# Patient Record
Sex: Male | Born: 1965 | Race: Black or African American | Hispanic: No | Marital: Single | Smoking: Current every day smoker
Health system: Southern US, Community
[De-identification: ages and names within clinical notes are randomized; demographics above are authoritative.]

## PROBLEM LIST (undated history)

## (undated) DIAGNOSIS — Z21 Asymptomatic human immunodeficiency virus [HIV] infection status: Secondary | ICD-10-CM

## (undated) DIAGNOSIS — B20 Human immunodeficiency virus [HIV] disease: Secondary | ICD-10-CM

## (undated) DIAGNOSIS — F1910 Other psychoactive substance abuse, uncomplicated: Secondary | ICD-10-CM

## (undated) HISTORY — PX: FINGER SURGERY: SHX640

---

## 2016-05-24 ENCOUNTER — Encounter: Payer: Self-pay | Admitting: Emergency Medicine

## 2016-05-24 ENCOUNTER — Emergency Department
Admission: EM | Admit: 2016-05-24 | Discharge: 2016-05-24 | Disposition: A | Discharge status: Home / Self Care | Payer: Self-pay | Attending: Student | Admitting: Student

## 2016-05-24 DIAGNOSIS — R45851 Suicidal ideations: Secondary | ICD-10-CM | POA: Insufficient documentation

## 2016-05-24 DIAGNOSIS — F17210 Nicotine dependence, cigarettes, uncomplicated: Secondary | ICD-10-CM | POA: Insufficient documentation

## 2016-05-24 DIAGNOSIS — F1910 Other psychoactive substance abuse, uncomplicated: Secondary | ICD-10-CM | POA: Insufficient documentation

## 2016-05-24 LAB — URINE DRUG SCREEN, QUALITATIVE (ARMC ONLY)
AMPHETAMINES, UR SCREEN: NOT DETECTED
Barbiturates, Ur Screen: NOT DETECTED
Benzodiazepine, Ur Scrn: NOT DETECTED
Cannabinoid 50 Ng, Ur ~~LOC~~: NOT DETECTED
Cocaine Metabolite,Ur ~~LOC~~: POSITIVE — AB
MDMA (ECSTASY) UR SCREEN: NOT DETECTED
METHADONE SCREEN, URINE: NOT DETECTED
OPIATE, UR SCREEN: NOT DETECTED
PHENCYCLIDINE (PCP) UR S: NOT DETECTED
Tricyclic, Ur Screen: NOT DETECTED

## 2016-05-24 LAB — CBC
HEMATOCRIT: 47.7 % (ref 40.0–52.0)
HEMOGLOBIN: 16.6 g/dL (ref 13.0–18.0)
MCH: 31.7 pg (ref 26.0–34.0)
MCHC: 34.8 g/dL (ref 32.0–36.0)
MCV: 91.2 fL (ref 80.0–100.0)
Platelets: 287 10*3/uL (ref 150–440)
RBC: 5.23 MIL/uL (ref 4.40–5.90)
RDW: 12.9 % (ref 11.5–14.5)
WBC: 20.3 10*3/uL — AB (ref 3.8–10.6)

## 2016-05-24 LAB — COMPREHENSIVE METABOLIC PANEL
ALBUMIN: 4.9 g/dL (ref 3.5–5.0)
ALT: 26 U/L (ref 17–63)
AST: 46 U/L — AB (ref 15–41)
Alkaline Phosphatase: 64 U/L (ref 38–126)
Anion gap: 10 (ref 5–15)
BILIRUBIN TOTAL: 1.1 mg/dL (ref 0.3–1.2)
BUN: 21 mg/dL — AB (ref 6–20)
CHLORIDE: 103 mmol/L (ref 101–111)
CO2: 22 mmol/L (ref 22–32)
Calcium: 9.7 mg/dL (ref 8.9–10.3)
Creatinine, Ser: 1.37 mg/dL — ABNORMAL HIGH (ref 0.61–1.24)
GFR calc Af Amer: >60 mL/min (ref >60)
GFR calc non Af Amer: 58 mL/min — ABNORMAL LOW (ref >60)
GLUCOSE: 115 mg/dL — AB (ref 65–99)
POTASSIUM: 4.3 mmol/L (ref 3.5–5.1)
SODIUM: 135 mmol/L (ref 135–145)
Total Protein: 8.9 g/dL — ABNORMAL HIGH (ref 6.5–8.1)

## 2016-05-24 LAB — ACETAMINOPHEN LEVEL

## 2016-05-24 LAB — ETHANOL: Alcohol, Ethyl (B): <5 mg/dL (ref <5)

## 2016-05-24 LAB — SALICYLATE LEVEL: Salicylate Lvl: <4 mg/dL (ref 2.8–30.0)

## 2016-05-24 NOTE — ED Notes (Signed)
Patient taking a shower. He will be discharged afterwards. He denies SI or HI. Discharge instructions reviewed with patient, he verbalizes understanding. Patient received all personal belongings.

## 2016-05-24 NOTE — ED Triage Notes (Signed)
Patient brought in by bpd. Patient states that he wants help to quit using drugs. Patient states that he has been using crack cocaine since the 80's. Patient also states that he has had thoughts of wanting to hurt himself, denies a plan.

## 2016-05-24 NOTE — ED Provider Notes (Signed)
-----------------------------------------   10:51 AM on 05/24/2016 -----------------------------------------  Patient is here voluntarily for substance abuse. Patient has no SI today. Patient wishes to go home at this time. We will discharge the patient home, he has been provided outpatient resources.   Minna Antis, MD 05/24/16 1051

## 2016-05-24 NOTE — ED Notes (Signed)
Report given to Amy,RN at this time. 

## 2016-05-24 NOTE — ED Provider Notes (Signed)
[facility] Emergency Department Provider Note   ____________________________________________  Time seen: 2:15 AM  I have reviewed the triage vital signs and the nursing notes.   HISTORY  Chief Complaint Suicidal    HPI Dakota Johnson is a 50 y.o. male with longstanding issues with substance abuse who presents for evaluation of suicidal ideation today as well as with the desire to detox from crack cocaine, gradual onset, severe, worse in the setting of him being recently homeless. The patient reports that he last used crack cocaine earlier today. He reports that he became depressed about his life, about his homelessness and briefly had thoughts that he should commit suicide. He has no specific plan for this. No homicidal ideation or audiovisual hallucinations.no recent illness including no cough, vomiting, diarrhea, fevers or chills. No pain complaints including no chest pain, or abdominal pain. No shortness of breath.   History reviewed. No pertinent past medical history.  There are no active problems to display for this patient.   Past Surgical History:  Procedure Laterality Date  . FINGER SURGERY      Prior to Admission medications   Not on File    Allergies Review of patient's allergies indicates no known allergies.  No family history on file.  Social History Social History  Substance Use Topics  . Smoking status: Current Every Day Smoker    Types: Cigarettes  . Smokeless tobacco: Never Used  . Alcohol use 6.0 oz/week    10 Cans of beer per week    Review of Systems Constitutional: No fever/chills Eyes: No visual changes. ENT: No sore throat. Cardiovascular: Denies chest pain. Respiratory: Denies shortness of breath. Gastrointestinal: No abdominal pain.  No nausea, no vomiting.  No diarrhea.  No constipation. Genitourinary: Negative for dysuria. Musculoskeletal: Negative for back pain. Skin: Negative for rash. Neurological:  Negative for headaches, focal weakness or numbness.  10-point ROS otherwise negative.  ____________________________________________   PHYSICAL EXAM:  VITAL SIGNS: ED Triage Vitals  Enc Vitals Group     BP 05/24/16 0208 140/85     Pulse Rate 05/24/16 0207 (!) 106     Resp 05/24/16 0207 18     Temp 05/24/16 0207 98.2 F (36.8 C)     Temp Source 05/24/16 0207 Oral     SpO2 05/24/16 0207 98 %     Weight 05/24/16 0207 185 lb (83.9 kg)     Height 05/24/16 0207  (1.778 m)     Head Circumference --      Peak Flow --      Pain Score 05/24/16 0208 7     Pain Loc --      Pain Edu? --      Excl. in GC? --     Constitutional: Alert and oriented. Well appearing and in no acute distress. Eyes: Conjunctivae are normal. PERRL. EOMI. Head: Atraumatic. Nose: No congestion/rhinnorhea. Mouth/Throat: Mucous membranes are moist.  Oropharynx non-erythematous. Neck: No stridor.   Cardiovascular: Mildly tachycardic rate, regular rhythm. Grossly normal heart sounds.  Good peripheral circulation. Respiratory: Normal respiratory effort.  No retractions. Lungs CTAB. Gastrointestinal: Soft and nontender. No distention.  No CVA tenderness. Genitourinary: deferred Musculoskeletal: No lower extremity tenderness nor edema.  No joint effusions. Neurologic:  Normal speech and language. No gross focal neurologic deficits are appreciated. No gait instability. Skin:  Skin is warm, dry and intact. No rash noted. Psychiatric: Mood and affect are normal. Speech and behavior are normal.  ____________________________________________   LABS (  all labs ordered are listed, but only abnormal results are displayed)  Labs Reviewed  COMPREHENSIVE METABOLIC PANEL - Abnormal; Notable for the following:       Result Value   Glucose, Bld 115 (*)    BUN 21 (*)    Creatinine, Ser 1.37 (*)    Total Protein 8.9 (*)    AST 46 (*)    GFR calc non Af Amer 58 (*)    All other components within normal limits    ACETAMINOPHEN LEVEL - Abnormal; Notable for the following:    Acetaminophen (Tylenol), Serum <10 (*)    All other components within normal limits  CBC - Abnormal; Notable for the following:    WBC 20.3 (*)    All other components within normal limits  URINE DRUG SCREEN, QUALITATIVE ([facility] ONLY) - Abnormal; Notable for the following:    Cocaine Metabolite,Ur [place] POSITIVE (*)    All other components within normal limits  ETHANOL  SALICYLATE LEVEL   ____________________________________________  EKG  none ____________________________________________  RADIOLOGY  none ____________________________________________   PROCEDURES  Procedure(s) performed: None  Procedures  Critical Care performed: No  ____________________________________________   INITIAL IMPRESSION / ASSESSMENT AND PLAN / ED COURSE  Pertinent labs & imaging results that were available during my care of the patient were reviewed by me and considered in my medical decision making (see chart for details).  Dakota Johnson is a 50 y.o. male with longstanding issues with substance abusewho presents for evaluation of suicidal ideation today as well as with the desire to detox from crack cocaine. On exam, he is very well-appearing and in no acute distress. Vital signs are notable for mild tachycardia, this could be secondary to his crack cocaine use, will reassess frequently. The remainder of his vital signs are stable and he is afebrile. He has a benign physical exam and no acute medical complaints. Will consult behavioral health as well as psychiatry. I reviewed the screening psychiatric labs which were ordered.White blood cell count is elevated at 20,000 which again is likely related  to his crack cocaine use/is catecholamine-induced. Urine drug screen is positive for cocaine only. Undetectable alcohol, salicylate and acetaminophen levels.creatinine mildly elevated at 1.37. The patient is medically cleared for TTS and  psychiatry evaluation.  Clinical Course     ____________________________________________   FINAL CLINICAL IMPRESSION(S) / ED DIAGNOSES  Final diagnoses:  Suicidal ideation  Polysubstance abuse      NEW MEDICATIONS STARTED DURING THIS VISIT:  New Prescriptions   No medications on file     Note:  This document was prepared using Dragon voice recognition software and may include unintentional dictation errors.    Gayla Doss, MD 05/24/16 [phone]

## 2016-05-24 NOTE — ED Notes (Signed)
Patient asleep in room. No noted distress or abnormal behavior. Will continue 15 minute checks and observation by security cameras for safety. 

## 2016-05-24 NOTE — ED Notes (Signed)
Patient awake, alert, and oriented. Cooperative with all nursing interventions. He denies SI or HI. Patient states that he wants help with addiction, specifically residential treatment.

## 2016-05-24 NOTE — ED Notes (Signed)
Patient remains calm and cooperative with no issues to report at this time. Will continue to monitor for safety.Q15 minute rounds continue.

## 2016-05-24 NOTE — BH Assessment (Signed)
Assessment Note  Dakota Johnson is an 50 y.o. male. Dakota Johnson arrived to the ED by way of [place] police.  He reports that he flagged them down. He reports that his drug use was a problem.  He states the more he uses, the worse his cravings are getting.  He states the drugs are starting to work on his mind and he wants to get some help. He report "slight" depression.  He states that earlier he was having "suicidal thoughts running across my mind". He denied having current thoughts of suicidal.  He states that he "got out of prison" today, and went back to using.  He denied symptoms of anxiety. He denied having auditory or visual hallucinations. He denied having homicidal ideation or intent. He reports the use of crack cocaine daily.  Diagnosis: SI, Substance abuse  Past Medical History: History reviewed. No pertinent past medical history.  Past Surgical History:  Procedure Laterality Date  . FINGER SURGERY      Family History: No family history on file.  Social History:  reports that he has been smoking Cigarettes.  He has never used smokeless tobacco. He reports that he drinks about 6.0 oz of alcohol per week . He reports that he uses drugs, including Cocaine.  Additional Social History:  Alcohol / Drug Use History of alcohol / drug use?: Yes Substance #1 Name of Substance 1: Cocaine (Crack) 1 - Age of First Use: 23 1 - Amount (size/oz): varied 1 - Frequency: daily 1 - Last Use / Amount: 05/23/2016  CIWA: CIWA-Ar BP: 140/85 Pulse Rate: (!) 106 COWS:    Allergies: No Known Allergies  Home Medications:  (Not in a hospital admission)  OB/GYN Status:  No LMP for male patient.  General Assessment Data Location of Assessment: [facility] ED TTS Assessment: In system Is this a Tele or Face-to-Face Assessment?: Face-to-Face Is this an Initial Assessment or a Re-assessment for this encounter?: Initial Assessment Marital status: Single Maiden name: n/a Is patient pregnant?:  No Pregnancy Status: No Living Arrangements: Alone (Currently homeless) Can pt return to current living arrangement?: Yes Admission Status: Voluntary Is patient capable of signing voluntary admission?: Yes Referral Source: Self/Family/Friend Insurance type: None  Medical Screening Exam [facility] Walk-in ONLY) Medical Exam completed: Yes  Crisis Care Plan Living Arrangements: Alone (Currently homeless) Legal Guardian: Other: (Self) Name of Psychiatrist: None Name of Therapist: None  Education Status Is patient currently in school?: No Current Grade: n/a Highest grade of school patient has completed: one year of college Name of school: [employer] Contact person: n/a  Risk to self with the past 6 months Suicidal Ideation: No-Not Currently/Within Last 6 Months Has patient been a risk to self within the past 6 months prior to admission? : No Suicidal Intent: No Has patient had any suicidal intent within the past 6 months prior to admission? : No Is patient at risk for suicide?: No Suicidal Plan?: No Has patient had any suicidal plan within the past 6 months prior to admission? : No Access to Means: No What has been your use of drugs/alcohol within the last 12 months?: daily use of cocaine Previous Attempts/Gestures: No How many times?: 0 Other Self Harm Risks: denied Triggers for Past Attempts: None known Intentional Self Injurious Behavior: None Family Suicide History: Yes (Brother (2000)) Recent stressful life event(s): Other (Comment) (Substance abuse, recent incarceration) Persecutory voices/beliefs?: No Depression: Yes Depression Symptoms: Feeling worthless/self pity Substance abuse history and/or treatment for substance abuse?: Yes Suicide prevention  information given to non-admitted patients: Not applicable  Risk to Others within the past 6 months Homicidal Ideation: No Does patient have any lifetime risk of violence toward others beyond the six months prior to  admission? : No Thoughts of Harm to Others: No Current Homicidal Intent: No Current Homicidal Plan: No Access to Homicidal Means: No Identified Victim: None identified History of harm to others?: No Assessment of Violence: None Noted Violent Behavior Description: denied Does patient have access to weapons?: No Criminal Charges Pending?: No Does patient have a court date: No Is patient on probation?: No  Psychosis Hallucinations: None noted Delusions: None noted  Mental Status Report Appearance/Hygiene: In scrubs Eye Contact: Good Motor Activity: Freedom of movement Speech: Logical/coherent Level of Consciousness: Alert Mood: Depressed Affect: Flat Anxiety Level: None Thought Processes: Coherent Judgement: Unimpaired Orientation: Person, Time, Place, Situation Obsessive Compulsive Thoughts/Behaviors: None  Cognitive Functioning Concentration: Normal Memory: Recent Intact IQ: Average Insight: Good Impulse Control: Fair Appetite: Good Sleep: No Change Vegetative Symptoms: None  ADLScreening [facility]) Patient's cognitive ability adequate to safely complete daily activities?: Yes Patient able to express need for assistance with ADLs?: Yes Independently performs ADLs?: Yes (appropriate for developmental age)  Prior Inpatient Therapy Prior Inpatient Therapy: No Prior Therapy Dates: n/a Prior Therapy Facilty/Provider(s): n/a Reason for Treatment: n/a  Prior Outpatient Therapy Prior Outpatient Therapy: No Prior Therapy Dates: n/a Prior Therapy Facilty/Provider(s): n/a Reason for Treatment: n/a Does patient have an ACCT team?: No Does patient have Intensive In-House Services?  : No Does patient have [facility] services? : No Does patient have P4CC services?: No  ADL Screening (condition at time of admission) Patient's cognitive ability adequate to safely complete daily activities?: Yes Patient able to express need for assistance with ADLs?:  Yes Independently performs ADLs?: Yes (appropriate for developmental age)       Abuse/Neglect Assessment (Assessment to be complete while patient is alone) Physical Abuse: Denies Verbal Abuse: Denies Sexual Abuse: Denies Exploitation of patient/patient's resources: Denies Self-Neglect: Denies     Merchant navy officer (For Healthcare) Does patient have an advance directive?: No    Additional Information 1:1 In Past 12 Months?: No CIRT Risk: No Elopement Risk: No Does patient have medical clearance?: Yes     Disposition:  Disposition Initial Assessment Completed for this Encounter: Yes Disposition of Patient: Other dispositions  On Site Evaluation by:   Reviewed with Physician:    Justice Deeds 05/24/2016 3:26 AM

## 2016-05-24 NOTE — ED Notes (Signed)

## 2016-05-24 NOTE — ED Notes (Signed)
Patient to [facility] from ED ambulatory without difficulty, to room.  Patient is is alert and oriented and in no apparent distress. Patient denies SI, HI. Patient is calm and cooperative. Patient made aware of security cameras and Q15 minute rounds. Patient encouraged to let Nursing staff know of any concerns or needs.

## 2016-05-30 ENCOUNTER — Emergency Department
Admission: EM | Admit: 2016-05-30 | Discharge: 2016-05-30 | Disposition: A | Discharge status: Other Acute Inpatient Hospital | Payer: Self-pay | Attending: Emergency Medicine | Admitting: Emergency Medicine

## 2016-05-30 ENCOUNTER — Observation Stay (HOSPITAL_COMMUNITY)
Admission: AD | Admit: 2016-05-30 | Discharge: 2016-05-31 | Disposition: A | Discharge status: Home / Self Care | Payer: Self-pay | Source: Intra-hospital | Attending: Psychiatry | Admitting: Psychiatry

## 2016-05-30 ENCOUNTER — Encounter: Payer: Self-pay | Admitting: Emergency Medicine

## 2016-05-30 ENCOUNTER — Encounter (HOSPITAL_COMMUNITY): Payer: Self-pay | Admitting: *Deleted

## 2016-05-30 DIAGNOSIS — F1410 Cocaine abuse, uncomplicated: Principal | ICD-10-CM | POA: Insufficient documentation

## 2016-05-30 DIAGNOSIS — F17210 Nicotine dependence, cigarettes, uncomplicated: Secondary | ICD-10-CM | POA: Insufficient documentation

## 2016-05-30 DIAGNOSIS — Z5181 Encounter for therapeutic drug level monitoring: Secondary | ICD-10-CM | POA: Insufficient documentation

## 2016-05-30 DIAGNOSIS — F1910 Other psychoactive substance abuse, uncomplicated: Secondary | ICD-10-CM

## 2016-05-30 HISTORY — DX: Other psychoactive substance abuse, uncomplicated: F19.10

## 2016-05-30 LAB — COMPREHENSIVE METABOLIC PANEL
ALT: 35 U/L (ref 17–63)
AST: 46 U/L — ABNORMAL HIGH (ref 15–41)
Albumin: 4.5 g/dL (ref 3.5–5.0)
Alkaline Phosphatase: 53 U/L (ref 38–126)
Anion gap: 6 (ref 5–15)
BILIRUBIN TOTAL: 0.8 mg/dL (ref 0.3–1.2)
BUN: 14 mg/dL (ref 6–20)
CALCIUM: 9 mg/dL (ref 8.9–10.3)
CHLORIDE: 107 mmol/L (ref 101–111)
CO2: 24 mmol/L (ref 22–32)
Creatinine, Ser: 1.05 mg/dL (ref 0.61–1.24)
Glucose, Bld: 121 mg/dL — ABNORMAL HIGH (ref 65–99)
Potassium: 3.8 mmol/L (ref 3.5–5.1)
Sodium: 137 mmol/L (ref 135–145)
TOTAL PROTEIN: 8.3 g/dL — AB (ref 6.5–8.1)

## 2016-05-30 LAB — URINE DRUG SCREEN, QUALITATIVE (ARMC ONLY)
Amphetamines, Ur Screen: NOT DETECTED
BARBITURATES, UR SCREEN: NOT DETECTED
Benzodiazepine, Ur Scrn: NOT DETECTED
CANNABINOID 50 NG, UR ~~LOC~~: NOT DETECTED
Cocaine Metabolite,Ur ~~LOC~~: POSITIVE — AB
MDMA (ECSTASY) UR SCREEN: NOT DETECTED
Methadone Scn, Ur: NOT DETECTED
Opiate, Ur Screen: NOT DETECTED
PHENCYCLIDINE (PCP) UR S: NOT DETECTED
TRICYCLIC, UR SCREEN: NOT DETECTED

## 2016-05-30 LAB — CBC
HCT: 44 % (ref 40.0–52.0)
Hemoglobin: 15 g/dL (ref 13.0–18.0)
MCH: 31.2 pg (ref 26.0–34.0)
MCHC: 34 g/dL (ref 32.0–36.0)
MCV: 91.7 fL (ref 80.0–100.0)
PLATELETS: 264 10*3/uL (ref 150–440)
RBC: 4.8 MIL/uL (ref 4.40–5.90)
RDW: 13.2 % (ref 11.5–14.5)
WBC: 12.1 10*3/uL — ABNORMAL HIGH (ref 3.8–10.6)

## 2016-05-30 LAB — ETHANOL

## 2016-05-30 LAB — ACETAMINOPHEN LEVEL: Acetaminophen (Tylenol), Serum: <10 ug/mL — ABNORMAL LOW (ref 10–30)

## 2016-05-30 LAB — SALICYLATE LEVEL

## 2016-05-30 MED ORDER — ACETAMINOPHEN 325 MG PO TABS: 650.0000 mg | ORAL_TABLET | Freq: Four times a day (QID) | ORAL | Status: DC | PRN

## 2016-05-30 MED ORDER — HYDROXYZINE HCL 25 MG PO TABS: 25.0000 mg | ORAL_TABLET | ORAL | Status: DC | PRN

## 2016-05-30 MED ORDER — TRAZODONE HCL 50 MG PO TABS: 50.0000 mg | ORAL_TABLET | Freq: Every evening | ORAL | Status: DC | PRN

## 2016-05-30 NOTE — ED Triage Notes (Signed)
"  I just don't know what to do.  I'm on crack cocaine and it is getting to the point that I am feeling like I want to end it at times.  I just need to get some help."  Arrives to ED with [employer] voluntarily .  Denies current SI/ HI but states earlier today he was having SI and HI.

## 2016-05-30 NOTE — ED Notes (Signed)
Patient has signed consent to transfer form.

## 2016-05-30 NOTE — ED Notes (Signed)
Transportation has arrived. Dakota Johnson and this RN ambulated patient to lobby for pick up.

## 2016-05-30 NOTE — BH Assessment (Signed)
Assessment Note  Dakota Johnson is an 50 y.o. male who presents to the ER seeking assistance and treatment for his cocaine and alcohol use. Patient states he has abused drugs since the "80's." The longest he has gone without anything is 11 years. His last time of use was today, 05/30/2016.  Patient denies past substance abuse and mental health treatment. However, in the past he's attended NA Meetings. The last one was approximately a year ago.  Patient reports of having symptoms of depression. All of which are due to his substance use. He denies current symptoms of withdrawal. He have no history of seizures or blackouts.  Patient denies current involvement with the legal system and with DSS.   Patient denies SI/HI and AV/H  Diagnosis: Cocaine & Substance Use Disorder  Past Medical History:  Past Medical History:  Diagnosis Date  . Drug abuse     Past Surgical History:  Procedure Laterality Date  . FINGER SURGERY      Family History: No family history on file.  Social History:  reports that he has been smoking Cigarettes.  He has been smoking about 1.00 pack per day. He has never used smokeless tobacco. He reports that he drinks about 6.0 oz of alcohol per week . He reports that he uses drugs, including Cocaine.  Additional Social History:  Alcohol / Drug Use Pain Medications: See PTA Prescriptions: See PTA Over the Counter: See PTA History of alcohol / drug use?: Yes Longest period of sobriety (when/how long): 11 years Negative Consequences of Use: Financial, Personal relationships (Reports of none) Substance #1 Name of Substance 1: Cocaine (Crack) 1 - Age of First Use: 23 1 - Amount (size/oz): "half oz" 1 - Frequency: 5 days of of the week 1 - Duration: "Since the late 80's." 1 - Last Use / Amount: 05/30/2016 Substance #2 Name of Substance 2: Alcohol 2 - Age of First Use: 9 2 - Amount (size/oz): "About a beer a day or two (24oz)." 2 - Frequency: 2 days out of the  week 2 - Duration: "About 10 years." 2 - Last Use / Amount: 05/30/2016  CIWA: CIWA-Ar BP: 122/78 Pulse Rate: (!) 101 COWS:    Allergies: No Known Allergies  Home Medications:  (Not in a hospital admission)  OB/GYN Status:  No LMP for male patient.  General Assessment Data Location of Assessment: [facility] ED TTS Assessment: In system Is this a Tele or Face-to-Face Assessment?: Face-to-Face Is this an Initial Assessment or a Re-assessment for this encounter?: Initial Assessment Marital status: Single Maiden name: n/a Is patient pregnant?: No Pregnancy Status: No Living Arrangements: Alone Can pt return to current living arrangement?: Yes Admission Status: Voluntary Is patient capable of signing voluntary admission?: Yes Referral Source: Self/Family/Friend Insurance type: None  Medical Screening Exam [facility] Walk-in ONLY) Medical Exam completed: Yes  Crisis Care Plan Living Arrangements: Alone Legal Guardian: Other: (Reports of none) Name of Psychiatrist: Reports of none Name of Therapist: Reports of none  Education Status Is patient currently in school?: No Current Grade: n/a Highest grade of school patient has completed: Some College Name of school: [employer] Contact person: n/a  Risk to self with the past 6 months Suicidal Ideation: No-Not Currently/Within Last 6 Months (Due to substance use) Has patient been a risk to self within the past 6 months prior to admission? : No Suicidal Intent: No Has patient had any suicidal intent within the past 6 months prior to admission? : No Is patient at  risk for suicide?: No Suicidal Plan?: No Has patient had any suicidal plan within the past 6 months prior to admission? : No Access to Means: No What has been your use of drugs/alcohol within the last 12 months?: Cocaine & Alcohol Previous Attempts/Gestures: No Other Self Harm Risks: Active Addiction Triggers for Past Attempts: None known Intentional Self Injurious  Behavior: None Family Suicide History: Yes (Brother, brother was 51 yo. Happen in 2000) Recent stressful life event(s):  (Active Addiction) Persecutory voices/beliefs?: No Depression: Yes Depression Symptoms: Feeling worthless/self pity, Loss of interest in usual pleasures, Guilt, Fatigue, Isolating, Tearfulness Substance abuse history and/or treatment for substance abuse?: Yes Suicide prevention information given to non-admitted patients: Not applicable  Risk to Others within the past 6 months Homicidal Ideation: No Does patient have any lifetime risk of violence toward others beyond the six months prior to admission? : No Thoughts of Harm to Others: No Current Homicidal Intent: No Current Homicidal Plan: No Access to Homicidal Means: No Identified Victim: Reports of none History of harm to others?: No Assessment of Violence: None Noted Violent Behavior Description: Reports of none Does patient have access to weapons?: No Criminal Charges Pending?: No Does patient have a court date: No Is patient on probation?: No  Psychosis Hallucinations: None noted Delusions: None noted  Mental Status Report Appearance/Hygiene: In scrubs, Unremarkable, In hospital gown Eye Contact: Poor Motor Activity: Freedom of movement, Unremarkable Speech: Logical/coherent, Soft, Unremarkable Level of Consciousness: Alert Mood: Depressed, Sad, Pleasant Affect: Sad, Appropriate to circumstance, Depressed Anxiety Level: Minimal Thought Processes: Coherent, Relevant Judgement: Unimpaired Orientation: Person, Place, Time, Situation, Appropriate for developmental age Obsessive Compulsive Thoughts/Behaviors: Minimal  Cognitive Functioning Concentration: Normal Memory: Recent Intact, Remote Intact IQ: Average Insight: Poor Impulse Control: Poor Appetite: Poor Weight Loss: 10 (Due to substance use) Weight Gain: 0 Sleep: Decreased Total Hours of Sleep: 4 Vegetative Symptoms: None  ADLScreening  [facility] Patient's cognitive ability adequate to safely complete daily activities?: Yes Patient able to express need for assistance with ADLs?: Yes Independently performs ADLs?: Yes (appropriate for developmental age)  Prior Inpatient Therapy Prior Inpatient Therapy: No Prior Therapy Dates: Reports of none Prior Therapy Facilty/Provider(s): Reports of none Reason for Treatment: Reports of none  Prior Outpatient Therapy Prior Outpatient Therapy: No (Did attend NA meetings) Prior Therapy Dates: Reports of none Prior Therapy Facilty/Provider(s): Reports of none Reason for Treatment: Reports of none Does patient have an ACCT team?: No Does patient have Intensive In-House Services?  : No Does patient have [facility] services? : No Does patient have P4CC services?: No  ADL Screening (condition at time of admission) Patient's cognitive ability adequate to safely complete daily activities?: Yes Is the patient deaf or have difficulty hearing?: No Does the patient have difficulty seeing, even when wearing glasses/contacts?: No Does the patient have difficulty concentrating, remembering, or making decisions?: No Patient able to express need for assistance with ADLs?: Yes Does the patient have difficulty dressing or bathing?: No Independently performs ADLs?: Yes (appropriate for developmental age) Does the patient have difficulty walking or climbing stairs?: No Weakness of Legs: None Weakness of Arms/Hands: None  Home Assistive Devices/Equipment Home Assistive Devices/Equipment: None  Therapy Consults (therapy consults require a physician order) PT Evaluation Needed: No OT Evalulation Needed: No SLP Evaluation Needed: No Abuse/Neglect Assessment (Assessment to be complete while patient is alone) Physical Abuse: Denies Verbal Abuse: Denies Sexual Abuse: Denies Exploitation of patient/patient's resources: Denies Self-Neglect: Denies Values / Beliefs Cultural Requests  During Hospitalization: None Spiritual  Requests During Hospitalization: None Consults Spiritual Care Consult Needed: No Social Work Consult Needed: No Merchant navy officer (For Healthcare) Does patient have an advance directive?: No Would patient like information on creating an advanced directive?: Yes English as a second language teacher given    Additional Information 1:1 In Past 12 Months?: No CIRT Risk: No Elopement Risk: No Does patient have medical clearance?: Yes  Child/Adolescent Assessment Running Away Risk: Denies (Patient is an adult)  Disposition:  Disposition Initial Assessment Completed for this Encounter: Yes Disposition of Patient: Other dispositions  On Site Evaluation by:   Reviewed with Physician:    Lilyan Gilford MS, LCAS, LPC, NCC, CCSI Therapeutic Triage Specialist 05/30/2016 3:54 PM

## 2016-05-30 NOTE — Progress Notes (Addendum)
Admission Note:  Patient is a 50 year old male admitted to Obs. Unit from [facility] requesting treatment for cocaine abuse.  Pt appears flat and depressed. On admission, patient was calm and cooperative. Patient stated "I just want help to stop using alcohol. I don't drink. Just the cocaine. The effect is no more good on me. I need help to quit" patient denies pain, SI, AH/VH at this time. Denies hx of abuse.  A: Skin was assessed, no contraband seen. Noted a small mole at right hip. Patient stated its been there from birth" no wound/bruises/tattoos noted.  POC and unit policies explained and understanding verbalized. Consents obtained. Accepted food and fluids offered. R: Patient had no additional questions or concerns.

## 2016-05-30 NOTE — ED Provider Notes (Signed)
[facility] Emergency Department Provider Note   ____________________________________________    I have reviewed the triage vital signs and the nursing notes.   HISTORY  Chief Complaint Mental Health Problem     HPI Dakota Johnson is a 50 y.o. male who presents requesting detox. He reports use of crack cocaine and that it makes him depressed. He reports he has felt suicidal in the past but he denies it currently. He has not tried to harm himself. He denies chest pain. No shortness of breath. Physically he feels well   Past Medical History:  Diagnosis Date  . Drug abuse     There are no active problems to display for this patient.   Past Surgical History:  Procedure Laterality Date  . FINGER SURGERY      Prior to Admission medications   Not on File     Allergies Review of patient's allergies indicates no known allergies.  No family history on file.  Social History Social History  Substance Use Topics  . Smoking status: Current Every Day Smoker    Packs/day: 1.00    Types: Cigarettes  . Smokeless tobacco: Never Used  . Alcohol use 6.0 oz/week    10 Cans of beer per week    Review of Systems  Constitutional: No Dizziness Eyes: No visual changes.   Cardiovascular: Denies chest pain. Respiratory: Denies shortness of breath. Gastrointestinal: No abdominal pain.   Musculoskeletal: Negative for back pain. Skin: Negative for rash. Neurological: Negative for headaches   10-point ROS otherwise negative.  ____________________________________________   PHYSICAL EXAM:  VITAL SIGNS: ED Triage Vitals  Enc Vitals Group     BP 05/30/16 1339 122/78     Pulse Rate 05/30/16 1339 (!) 101     Resp 05/30/16 1339 18     Temp 05/30/16 1339 98.3 F (36.8 C)     Temp Source 05/30/16 1339 Oral     SpO2 05/30/16 1339 97 %     Weight 05/30/16 1339 185 lb (83.9 kg)     Height 05/30/16 1339  (1.778 m)     Head Circumference --        Peak Flow --      Pain Score 05/30/16 1341 0     Pain Loc --      Pain Edu? --      Excl. in GC? --     Constitutional: Alert and oriented. No acute distress.  Eyes: Conjunctivae are normal.  Head: Atraumatic. Nose: No congestion/rhinnorhea. Mouth/Throat: Mucous membranes are moist.   Neck:  Painless ROM Cardiovascular: Normal rate, regular rhythm. Heart rate on my exam 90 .Grossly normal heart sounds.  Good peripheral circulation. Respiratory: Normal respiratory effort.  No retractions. Lungs CTAB. Gastrointestinal: Soft and nontender. No distention.  No CVA tenderness. Genitourinary: deferred Musculoskeletal: No lower extremity tenderness nor edema.  Warm and well perfused Neurologic:  Normal speech and language. No gross focal neurologic deficits are appreciated.  Skin:  Skin is warm, dry and intact.  Psychiatric: Mood and affect are normal. Speech and behavior are normal.  ____________________________________________   LABS (all labs ordered are listed, but only abnormal results are displayed)  Labs Reviewed  COMPREHENSIVE METABOLIC PANEL - Abnormal; Notable for the following:       Result Value   Glucose, Bld 121 (*)    Total Protein 8.3 (*)    AST 46 (*)    All other components within normal limits  CBC - Abnormal;  Notable for the following:    WBC 12.1 (*)    All other components within normal limits  URINE DRUG SCREEN, QUALITATIVE ([facility] ONLY) - Abnormal; Notable for the following:    Cocaine Metabolite,Ur [place] POSITIVE (*)    All other components within normal limits  ETHANOL  SALICYLATE LEVEL  ACETAMINOPHEN LEVEL   ____________________________________________  EKG  None ____________________________________________  RADIOLOGY  None ____________________________________________   PROCEDURES  Procedure(s) performed: No    Critical Care performed: No ____________________________________________   INITIAL IMPRESSION / ASSESSMENT AND PLAN / ED  COURSE  Pertinent labs & imaging results that were available during my care of the patient were reviewed by me and considered in my medical decision making (see chart for details).  Patient is here requesting detox. He was recently here for similar complaints but left. I will ask TTS to see the patient. Do not feel he meets criteria for IVC. He is not actively suicidal, he denies homicidal ideation. If cleared by TTS appropriate for discharge  Clinical Course   ____________________________________________   FINAL CLINICAL IMPRESSION(S) / ED DIAGNOSES  Final diagnoses:  Drug abuse      NEW MEDICATIONS STARTED DURING THIS VISIT:  New Prescriptions   No medications on file     Note:  This document was prepared using Dragon voice recognition software and may include unintentional dictation errors.    Jene Everyobert Lasondra Hodgkins, MD 05/30/16 [phone]

## 2016-05-30 NOTE — ED Notes (Signed)
Patient wishes to withdrawal off crack cocaine. Patient has been using since the 80's. Last time used was today at 10am. Patient states he is not suicidal at this time but states "It flashes through my mind now and then".

## 2016-05-30 NOTE — Progress Notes (Signed)
[facility] INPATIENT:  Family/Significant Other Suicide Prevention Education  Suicide Prevention Education:  Patient Refusal for Family/Significant Other Suicide Prevention Education: The patient Dakota PiqueDwight A Lapiana has refused to provide written consent for family/significant other to be provided Family/Significant Other Suicide Prevention Education during admission and/or prior to discharge.  Physician notified.  Glenice LaineIbekwe, Beyonce Sawatzky B 05/30/2016, 10:06 PM

## 2016-05-30 NOTE — BH Assessment (Signed)
Patient has been accepted to [facility], Observation.  Patient assigned to room Obs 5 Accepting physician is Dr. Lucianne MussKumar  Call report to [phone]  Representative was Lillia AbedLindsay.  ER Staff is aware of it Christen Bame(Ronnie, ER Sect.; Dr. Pershing ProudSchaevitz, ER MD & Kara MeadEmma, Patient's Nurse)  Writer called [facility] [phone]([phone]), to schedule pick up. They will [facility] at [facility] at approximately 6:00pm. The transporting staff will be Mellody DanceKeith.

## 2016-05-31 DIAGNOSIS — F1410 Cocaine abuse, uncomplicated: Secondary | ICD-10-CM

## 2016-05-31 NOTE — H&P (Signed)
[place] Observation Unit Provider Admission PAA/H&P  Patient Identification: Dakota Johnson MRN:  [id number] Date of Evaluation:  05/31/2016 Chief Complaint:  Patient states "I don't want my cocaine habit to affect me moving forward in my life."  Principal Diagnosis: Cocaine abuse Diagnosis:   Patient Active Problem List   Diagnosis Date Noted  . Cocaine abuse [F14.10] 05/30/2016   History of Present Illness:   Dakota Johnson is an 50 y.o. male who presents to the [facility] seeking assistance and treatment for his cocaine and alcohol use. Patient states he has abused cocaine since the 1980's "off and on."  The longest he has gone without anything is 11 years when he was serving a prison term.  His last time of use was , 05/30/2016.  Patient denies past substance abuse and mental health treatment. However, in the past he's attended a program called "[facility]" when in prison. Patient reports that he finished his sentence in August of last year doing well for several months. 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 Patient stated "I was doing pretty good. 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 Then I started going to [facility] to see family. That is where I used to use heavily. I thought I could handle it but then the use started picking up again. I want outpatient treatment because of my job. I just don't want my drug use to ruin what I have going for myself now. I do not want to hurt myself. I think I felt bad after using the cocaine. I would also like a halfway house to stay in. I need to call my boss before I leave here to let him know where I am. I plan on staying in [place] to get the treatment." Daniel was calm and cooperative during the assessment. He reported some mild depressive symptoms that appeared related to his recent use of cocaine. Patient's current urine drug screen was positive for cocaine. No evidence of any psychotic symptoms or homicidal ideation from  the assessment. Patient appears stable to discharge today with outpatient resources in place to meet his goals. Discussed available options such as the [facility] and [facility] to follow up with after discharge. Patient denies a prior history of mental illness but has a history of cocaine abuse.   Associated Signs/Symptoms: Depression Symptoms:  depressed mood, feelings of worthlessness/guilt, hopelessness, (Hypo) Manic Symptoms:  Denies Anxiety Symptoms:  Excessive Worry, Psychotic Symptoms:  Denies PTSD Symptoms: Denies Total Time spent with patient: 30 minutes  Past Psychiatric History: Cocaine abuse   Is the patient at risk to self? No.  Has the patient been a risk to self in the past 6 months? No.  Has the patient been a risk to self within the distant past? No.  Is the patient a risk to others? No.  Has the patient been a risk to others in the past 6 months? No.  Has the patient been a risk to others within the distant past? No.   Prior Inpatient Therapy:  Denies Prior Outpatient Therapy:  Denies   Alcohol Screening:  Denies recent use of alcohol  Substance Abuse History in the last 12 months:  Yes.   Consequences of Substance Abuse: Reports increased worrying that his use will cause unemployment.  Previous Psychotropic Medications: No  Psychological Evaluations: No  Past Medical History:  Past Medical History:  Diagnosis Date  . Drug abuse     Past Surgical History:  Procedure  Laterality Date  . FINGER SURGERY     Family History: History reviewed. No pertinent family history. Family Psychiatric History: Reports extensive substance abuse history in both his parents.  Tobacco Screening:   Social History:  History  Alcohol Use  . 6.0 oz/week  . 10 Cans of beer per week     History  Drug Use  . Types: Cocaine    Additional Social History:                           Allergies:  No Known Allergies Lab Results:  Results for orders placed or performed during the  hospital encounter of 05/30/16 (from the past 48 hour(s))  Comprehensive metabolic panel     Status: Abnormal   Collection Time: 05/30/16  1:44 PM  Result Value Ref Range   Sodium 137 135 - 145 mmol/L   Potassium 3.8 3.5 - 5.1 mmol/L   Chloride 107 101 - 111 mmol/L   CO2 24 22 - 32 mmol/L   Glucose, Bld 121 (H) 65 - 99 mg/dL   BUN 14 6 - 20 mg/dL   Creatinine, Ser 1.05 0.61 - 1.24 mg/dL   Calcium 9.0 8.9 - 10.3 mg/dL   Total Protein 8.3 (H) 6.5 - 8.1 g/dL   Albumin 4.5 3.5 - 5.0 g/dL   AST 46 (H) 15 - 41 U/L   ALT 35 17 - 63 U/L   Alkaline Phosphatase 53 38 - 126 U/L   Total Bilirubin 0.8 0.3 - 1.2 mg/dL   GFR calc non Af Amer >60 >60 mL/min   GFR calc Af Amer >60 >60 mL/min    Comment: (NOTE) The eGFR has been calculated using the CKD EPI equation. This calculation has not been validated in all clinical situations. eGFR's persistently <60 mL/min signify possible Chronic Kidney Disease.    Anion gap 6 5 - 15  Ethanol     Status: None   Collection Time: 05/30/16  1:44 PM  Result Value Ref Range   Alcohol, Ethyl (B) <5 <5 mg/dL    Comment:        LOWEST DETECTABLE LIMIT FOR SERUM ALCOHOL IS 5 mg/dL FOR MEDICAL PURPOSES ONLY   Salicylate level     Status: None   Collection Time: 05/30/16  1:44 PM  Result Value Ref Range   Salicylate Lvl <2.0 2.8 - 30.0 mg/dL  Acetaminophen level     Status: Abnormal   Collection Time: 05/30/16  1:44 PM  Result Value Ref Range   Acetaminophen (Tylenol), Serum <10 (L) 10 - 30 ug/mL    Comment:        THERAPEUTIC CONCENTRATIONS VARY SIGNIFICANTLY. A RANGE OF 10-30 ug/mL MAY BE AN EFFECTIVE CONCENTRATION FOR MANY PATIENTS. HOWEVER, SOME ARE BEST TREATED AT CONCENTRATIONS OUTSIDE THIS RANGE. ACETAMINOPHEN CONCENTRATIONS >150 ug/mL AT 4 HOURS AFTER INGESTION AND >50 ug/mL AT 12 HOURS AFTER INGESTION ARE OFTEN ASSOCIATED WITH TOXIC REACTIONS.   cbc     Status: Abnormal   Collection Time: 05/30/16  1:44 PM  Result Value Ref Range    WBC 12.1 (H) 3.8 - 10.6 K/uL   RBC 4.80 4.40 - 5.90 MIL/uL   Hemoglobin 15.0 13.0 - 18.0 g/dL   HCT 44.0 40.0 - 52.0 %   MCV 91.7 80.0 - 100.0 fL   MCH 31.2 26.0 - 34.0 pg   MCHC 34.0 32.0 - 36.0 g/dL   RDW 13.2 11.5 - 14.5 %   Platelets 264  150 - 440 K/uL  Urine Drug Screen, Qualitative     Status: Abnormal   Collection Time: 05/30/16  1:44 PM  Result Value Ref Range   Tricyclic, Ur Screen NONE DETECTED NONE DETECTED   Amphetamines, Ur Screen NONE DETECTED NONE DETECTED   MDMA (Ecstasy)Ur Screen NONE DETECTED NONE DETECTED   Cocaine Metabolite,Ur [place] POSITIVE (A) NONE DETECTED   Opiate, Ur Screen NONE DETECTED NONE DETECTED   Phencyclidine (PCP) Ur S NONE DETECTED NONE DETECTED   Cannabinoid 50 Ng, Ur [place] NONE DETECTED NONE DETECTED   Barbiturates, Ur Screen NONE DETECTED NONE DETECTED   Benzodiazepine, Ur Scrn NONE DETECTED NONE DETECTED   Methadone Scn, Ur NONE DETECTED NONE DETECTED    Comment: (NOTE) 585  Tricyclics, urine               Cutoff 1000 ng/mL 200  Amphetamines, urine             Cutoff 1000 ng/mL 300  MDMA (Ecstasy), urine           Cutoff 500 ng/mL 400  Cocaine Metabolite, urine       Cutoff 300 ng/mL 500  Opiate, urine                   Cutoff 300 ng/mL 600  Phencyclidine (PCP), urine      Cutoff 25 ng/mL 700  Cannabinoid, urine              Cutoff 50 ng/mL 800  Barbiturates, urine             Cutoff 200 ng/mL 900  Benzodiazepine, urine           Cutoff 200 ng/mL 1000 Methadone, urine                Cutoff 300 ng/mL 1100 1200 The urine drug screen provides only a preliminary, unconfirmed 1300 analytical test result and should not be used for non-medical 1400 purposes. Clinical consideration and professional judgment should 1500 be applied to any positive drug screen result due to possible 1600 interfering substances. A more specific alternate chemical method 1700 must be used in order to obtain a confirmed analytical result.  1800 Gas chromato graphy / mass  spectrometry (GC/MS) is the preferred 1900 confirmatory method.     Blood Alcohol level:  Lab Results  Component Value Date   ETH <5 05/30/2016   ETH <5 27/78/2423    Metabolic Disorder Labs:  No results found for: HGBA1C, MPG No results found for: PROLACTIN No results found for: CHOL, TRIG, HDL, CHOLHDL, VLDL, LDLCALC  Current Medications: Current Facility-Administered Medications  Medication Dose Route Frequency Provider Last Rate Last Dose  . acetaminophen (TYLENOL) tablet 650 mg  650 mg Oral Q6H PRN Lurena Nida, NP      . hydrOXYzine (ATARAX/VISTARIL) tablet 25 mg  25 mg Oral Q4H PRN Lurena Nida, NP      . traZODone (DESYREL) tablet 50 mg  50 mg Oral QHS PRN Lurena Nida, NP       PTA Medications: No prescriptions prior to admission.    Musculoskeletal: Strength & Muscle Tone: within normal limits Gait & Station: normal Patient leans: N/A  Psychiatric Specialty Exam: Physical Exam  Review of Systems  Psychiatric/Behavioral: Positive for substance abuse.    Blood pressure 102/79, pulse 88, temperature 98.1 F (36.7 C), temperature source Oral, resp. rate 18, height 5' 10"  (1.778 m), weight 83.9 kg (185 lb), SpO2 99 %.Body mass index  is 26.54 kg/m.  General Appearance: Casual  Eye Contact:  Good  Speech:  Clear and Coherent  Volume:  Normal  Mood:  Anxious  Affect:  Appropriate  Thought Process:  Coherent and Goal Directed  Orientation:  Full (Time, Place, and Person)  Thought Content:  Concerns about cocaine use   Suicidal Thoughts:  No  Homicidal Thoughts:  No  Memory:  Immediate;   Good Recent;   Good Remote;   Good  Judgement:  Fair  Insight:  Present  Psychomotor Activity:  Normal  Concentration:  Concentration: Good and Attention Span: Good  Recall:  Good  Fund of Knowledge:  Good  Language:  Good  Akathisia:  No  Handed:  Right  AIMS (if indicated):     Assets:  Communication Skills Desire for Improvement Leisure Time Physical  Health Resilience Vocational/Educational  ADL's:  Intact  Cognition:  WNL  Sleep:         Treatment Plan Summary: Daily contact with patient to assess and evaluate symptoms and progress in treatment and Medication management  Observation Level/Precautions:  Continuous Observation Laboratory:  CBC Chemistry Profile UDS Psychotherapy:  Individual for substance abuse counseling  Medications:  Trazodone prn insomnia Consultations:  None Discharge Concerns:  Continued cocaine use  Estimated LOS: 24 hours Other:  Provided with resources for the [facility] to help with housing and [facility] for substance abuse concerns     Elmarie Shiley, NP 8/7/201712:03 PM

## 2016-05-31 NOTE — Progress Notes (Signed)
D:  Patient denies suicidal and homicidal ideation and AVH;  No self-injurious behaviors noted or reported. Affect flat; mood depressed. A:  Emotional support provided; encouraged him to seek assistance with needs/concerns. R:  Safety maintained on unit.

## 2016-05-31 NOTE — Discharge Summary (Signed)
    [facility]-Observation Unit Discharge Summary   Dakota Johnson A Scottis an 50 y.o.malewho [facility] the [facility] seeking assistance and treatment for his cocaine and alcohol use. [facility] on the evening of 05/30/2016. Patient states he has abused cocaine since the 1980's "off and on." The longest he has gone without anything is eleven years when he was serving a prison term.  His last time of reported use was, 05/30/2016. Patient denies past substance abuse and mental health treatment. However, in the past he's attended a program called "[facility]" when in prison. Patient reports that he finished his sentence in August of last year doing well for several months. 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 Patient stated "I was doing pretty good. 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 Then [place]. That is where I used to use heavily. I thought I could handle it but then the use started picking up again. I want outpatient treatment because of my job. I just don't want my drug use to ruin what I have going for myself now. I do not want to hurt myself. I think I felt bad after using the cocaine. I would also like a halfway house to stay in. I need to call my boss before I leave here to let him know where I am. I plan on staying in [place] to get the treatment." Dakota Johnson was calm and cooperative during the assessment. He reported some mild depressive symptoms that appeared related to his recent use of cocaine. Patient's current urine drug screen was positive for cocaine. No evidence of any psychotic symptoms or homicidal ideation from the assessment. Patient appears stable to discharge today with outpatient resources in place to meet his goals. Discussed available options such as [facility] and [facility] to follow up with after discharge. Patient denies a prior history of mental illness but has a history of cocaine abuse. He  was monitored overnight in the Observation Unit and found to be in stable condition on 05/31/2016. [facility] with all belongings returned to him in stable condition with resources and a bus pass to assist with transportation.

## 2016-05-31 NOTE — Discharge Instructions (Signed)
Follow up with [facility] today [address] [place], [place] [address] Phone:  [phone]

## 2016-05-31 NOTE — Progress Notes (Signed)
Written/verbal discharge instructions and follow-up instructions given to patient with verbalization of understanding; patient denies suicidal and homicidal ideation and AVH;  Discharged home in stable condition; Given bus pass for transport.

## 2016-07-11 ENCOUNTER — Encounter: Payer: Self-pay | Admitting: Emergency Medicine

## 2016-07-11 ENCOUNTER — Emergency Department
Admission: EM | Admit: 2016-07-11 | Discharge: 2016-07-11 | Disposition: A | Discharge status: Home / Self Care | Payer: Self-pay | Attending: Emergency Medicine | Admitting: Emergency Medicine

## 2016-07-11 DIAGNOSIS — F17210 Nicotine dependence, cigarettes, uncomplicated: Secondary | ICD-10-CM | POA: Insufficient documentation

## 2016-07-11 DIAGNOSIS — F1490 Cocaine use, unspecified, uncomplicated: Secondary | ICD-10-CM | POA: Insufficient documentation

## 2016-07-11 DIAGNOSIS — F1920 Other psychoactive substance dependence, uncomplicated: Secondary | ICD-10-CM | POA: Insufficient documentation

## 2016-07-11 DIAGNOSIS — F1910 Other psychoactive substance abuse, uncomplicated: Secondary | ICD-10-CM

## 2016-07-11 LAB — CBC
HEMATOCRIT: 47.9 % (ref 40.0–52.0)
HEMOGLOBIN: 16.5 g/dL (ref 13.0–18.0)
MCH: 31.8 pg (ref 26.0–34.0)
MCHC: 34.5 g/dL (ref 32.0–36.0)
MCV: 92.2 fL (ref 80.0–100.0)
Platelets: 275 10*3/uL (ref 150–440)
RBC: 5.19 MIL/uL (ref 4.40–5.90)
RDW: 13.7 % (ref 11.5–14.5)
WBC: 16 10*3/uL — ABNORMAL HIGH (ref 3.8–10.6)

## 2016-07-11 LAB — COMPREHENSIVE METABOLIC PANEL
ALK PHOS: 65 U/L (ref 38–126)
ALT: 23 U/L (ref 17–63)
AST: 33 U/L (ref 15–41)
Albumin: 4.8 g/dL (ref 3.5–5.0)
Anion gap: 6 (ref 5–15)
BUN: 12 mg/dL (ref 6–20)
CALCIUM: 9.4 mg/dL (ref 8.9–10.3)
CHLORIDE: 103 mmol/L (ref 101–111)
CO2: 28 mmol/L (ref 22–32)
CREATININE: 1.25 mg/dL — AB (ref 0.61–1.24)
GFR calc non Af Amer: >60 mL/min (ref >60)
GLUCOSE: 117 mg/dL — AB (ref 65–99)
Potassium: 3.7 mmol/L (ref 3.5–5.1)
SODIUM: 137 mmol/L (ref 135–145)
Total Bilirubin: 1.2 mg/dL (ref 0.3–1.2)
Total Protein: 8.9 g/dL — ABNORMAL HIGH (ref 6.5–8.1)

## 2016-07-11 LAB — URINE DRUG SCREEN, QUALITATIVE (ARMC ONLY)
Amphetamines, Ur Screen: NOT DETECTED
BARBITURATES, UR SCREEN: NOT DETECTED
Benzodiazepine, Ur Scrn: NOT DETECTED
CANNABINOID 50 NG, UR ~~LOC~~: NOT DETECTED
COCAINE METABOLITE, UR ~~LOC~~: POSITIVE — AB
MDMA (ECSTASY) UR SCREEN: NOT DETECTED
Methadone Scn, Ur: NOT DETECTED
OPIATE, UR SCREEN: NOT DETECTED
PHENCYCLIDINE (PCP) UR S: NOT DETECTED
TRICYCLIC, UR SCREEN: NOT DETECTED

## 2016-07-11 LAB — ETHANOL: Alcohol, Ethyl (B): <5 mg/dL (ref <5)

## 2016-07-11 LAB — SALICYLATE LEVEL

## 2016-07-11 LAB — ACETAMINOPHEN LEVEL: Acetaminophen (Tylenol), Serum: <10 ug/mL — ABNORMAL LOW (ref 10–30)

## 2016-07-11 NOTE — ED Notes (Signed)

## 2016-07-11 NOTE — ED Triage Notes (Signed)
Pt presents to ED from home requesting detox from cocaine. Stated he feels like outpatient rehab is not helping and wants to be inpatient. States he last used cocaine and EtOH last night and today felt like walking into traffic.

## 2016-07-11 NOTE — BH Assessment (Signed)
Assessment Note  Dakota PiqueDwight A Johnson is an 50 y.o. male who presents to the ER due to his substance use. He reports of abusing cocaine, synthetic marijuana and alcohol. He [facility] and was discharged to an [place] [facility]. He was at the house for approximately three weeks. Per his report, that level of treatment didn't work for him. He's been on binge for the last several days.   The patient initially reported SI when he arrived to the ER. He explained to this Clinical research associatewriter, he have SI when he is using drugs. While using, his symptoms of depression increases as well. He experiences guilt, hopelessness and helplessness. Patient have no plan, intent or means for self-harm.  Patient also denies HI and AV/H. During the interview, patient was calm, cooperative and pleasant.  Discussed patient with ER MD (Dr. Scotty CourtStafford) and patient is able to discharge home when medically cleared. Patient was giving referral information and instructions on how to follow up with Outpatient Treatment ([facility]) and Mobile Crisis. Also provided the patient with the contact information for [facility] Peer Support Lorella Nimrod([place] [phone]).  Diagnosis: Cocaine Use Disorder, Moderate   Past Medical History:  Past Medical History:  Diagnosis Date  . Drug abuse     Past Surgical History:  Procedure Laterality Date  . FINGER SURGERY      Family History: History reviewed. No pertinent family history.  Social History:  reports that he has been smoking Cigarettes.  He has been smoking about 1.00 pack per day. He has never used smokeless tobacco. He reports that he drinks about 6.0 oz of alcohol per week . He reports that he uses drugs, including Cocaine.  Additional Social History:  Alcohol / Drug Use Pain Medications: See PTA Prescriptions: See PTA Over the Counter: See PTA Longest period of sobriety (when/how long): 11 years, while incarcerated Negative Consequences of Use: Financial, Personal  relationships Withdrawal Symptoms:  (Reports of none) Substance #1 Name of Substance 1: Cocaine (Crack) 1 - Age of First Use: 23 1 - Amount (size/oz): "half oz" 1 - Frequency: "I binge and go for a while without nothing."  1 - Duration: "Since the late 80's." 1 - Last Use / Amount: 07/10/2016 Substance #2 Name of Substance 2: Alcohol 2 - Age of First Use: 9 2 - Amount (size/oz): "Like a beer or so." 2 - Frequency: "About once a week and then I go for about two weeks without nothing." 2 - Duration: "For some years now" 2 - Last Use / Amount: 07/09/2016  CIWA: CIWA-Ar BP: (!) 132/93 Pulse Rate: 81 COWS:    Allergies: No Known Allergies  Home Medications:  (Not in a hospital admission)  OB/GYN Status:  No LMP for male patient.  General Assessment Data [facility] Assessment: In system Is this a Tele or Face-to-Face Assessment?: Face-to-Face Is this an Initial Assessment or a Re-assessment for this encounter?: Initial Assessment Marital status: Single Maiden name: n/a Is patient pregnant?: No Living Arrangements: Other (Comment), Non-relatives/Friends Can pt return to current living arrangement?: Yes Admission Status: Voluntary Is patient capable of signing voluntary admission?: Yes Referral Source: Self/Family/Friend Insurance type: None  Medical Screening Exam [facility] ONLY) Medical Exam completed: Yes  Crisis Care Plan Living Arrangements: Other (Comment), Non-relatives/Friends Legal Guardian: Other: (None) Name of Psychiatrist: Reports of none Name of Therapist: Reports of none  Education Status Is patient currently in school?: No Current Grade: n/a Highest grade of school patient has completed: Some  College Name of school: [employer], [employer] Contact person: n/a  Risk to self with the past 6 months Suicidal Ideation: No Has patient been a risk to self within the past 6 months prior to admission? : No Suicidal Intent: No Has  patient had any suicidal intent within the past 6 months prior to admission? : No Is patient at risk for suicide?: No Suicidal Plan?: No Has patient had any suicidal plan within the past 6 months prior to admission? : No Access to Means: No What has been your use of drugs/alcohol within the last 12 months?: Cocaine & Alcohol Previous Attempts/Gestures: No How many times?: 0 Other Self Harm Risks: Reports of none Triggers for Past Attempts: None known Intentional Self Injurious Behavior: None Family Suicide History: Yes (When his brother was 74, took place 63) Recent stressful life event(s): Other (Comment) Persecutory voices/beliefs?: No Depression: Yes Depression Symptoms: Feeling angry/irritable, Feeling worthless/self pity, Guilt Substance abuse history and/or treatment for substance abuse?: Yes Suicide prevention information given to non-admitted patients: Not applicable  Risk to Others within the past 6 months Homicidal Ideation: No Does patient have any lifetime risk of violence toward others beyond the six months prior to admission? : No Thoughts of Harm to Others: No Current Homicidal Intent: No Current Homicidal Plan: No Access to Homicidal Means: No Identified Victim: Reports of none History of harm to others?: No Assessment of Violence: None Noted Violent Behavior Description: Reports of none Does patient have access to weapons?: No Criminal Charges Pending?: No Does patient have a court date: No Is patient on probation?: No  Psychosis Hallucinations: None noted Delusions: None noted  Mental Status Report Appearance/Hygiene: Unremarkable, In scrubs Eye Contact: Good Motor Activity: Unremarkable, Freedom of movement Speech: Logical/coherent, Soft, Unremarkable Level of Consciousness: Alert Mood: Depressed, Helpless, Pleasant Affect: Sad, Appropriate to circumstance, Depressed Anxiety Level: Minimal Thought Processes: Coherent, Relevant Judgement:  Unimpaired Orientation: Person, Place, Time, Situation, Appropriate for developmental age Obsessive Compulsive Thoughts/Behaviors: Minimal  Cognitive Functioning Concentration: Normal Memory: Recent Intact, Remote Intact IQ: Average Insight: Fair Impulse Control: Poor Appetite: Good Weight Loss: 0 Weight Gain: 0 Sleep: No Change Total Hours of Sleep: 8 Vegetative Symptoms: None  ADLScreening [facility]) Patient's cognitive ability adequate to safely complete daily activities?: Yes Patient able to express need for assistance with ADLs?: Yes Independently performs ADLs?: Yes (appropriate for developmental age)  Prior Inpatient Therapy Prior Inpatient Therapy: Yes Prior Therapy Dates: While in prison Prior Therapy Facilty/Provider(s): [facility] Reason for Treatment: Substance Abuse  Prior Outpatient Therapy Prior Outpatient Therapy: No Prior Therapy Dates: Reports of none Prior Therapy Facilty/Provider(s): Reports of none Reason for Treatment: Reports of none Does patient have an ACCT team?: No Does patient have Intensive In-House Services?  : No Does patient have [facility] services? : No Does patient have P4CC services?: No  ADL Screening (condition at time of admission) Patient's cognitive ability adequate to safely complete daily activities?: Yes Is the patient deaf or have difficulty hearing?: No Does the patient have difficulty seeing, even when wearing glasses/contacts?: No Does the patient have difficulty concentrating, remembering, or making decisions?: No Patient able to express need for assistance with ADLs?: Yes Does the patient have difficulty dressing or bathing?: No Independently performs ADLs?: Yes (appropriate for developmental age) Does the patient have difficulty walking or climbing stairs?: No Weakness of Legs: None Weakness of Arms/Hands: None  Home Assistive Devices/Equipment Home Assistive Devices/Equipment: None  Therapy Consults  (therapy consults require a physician order) PT Evaluation  Needed: No OT Evalulation Needed: No SLP Evaluation Needed: No Abuse/Neglect Assessment (Assessment to be complete while patient is alone) Physical Abuse: Denies Verbal Abuse: Denies Sexual Abuse: Denies Exploitation of patient/patient's resources: Denies Self-Neglect: Denies Values / Beliefs Cultural Requests During Hospitalization: None Spiritual Requests During Hospitalization: None Consults Spiritual Care Consult Needed: No Social Work Consult Needed: No Merchant navy officer (For Healthcare) Does patient have an advance directive?: No Would patient like information on creating an advanced directive?: No - patient declined information    Additional Information 1:1 In Past 12 Months?: No CIRT Risk: No Elopement Risk: No Does patient have medical clearance?: Yes  Child/Adolescent Assessment Running Away Risk: Denies (Patient is an adult)  Disposition:  Disposition Initial Assessment Completed for this Encounter: Yes Disposition of Patient: Outpatient treatment  On Site Evaluation by:   Reviewed with Physician:    Lilyan Gilford MS, LCAS, LPC, NCC, CCSI Therapeutic Triage Specialist 07/11/2016 12:32 PM

## 2016-07-11 NOTE — ED Provider Notes (Signed)
[facility] Emergency Department Provider Note  ____________________________________________  Time seen: Approximately 11:14 AM  I have reviewed the triage vital signs and the nursing notes.   HISTORY  Chief Complaint Suicidal and Addiction Problem    HPI Dakota Johnson is a 50 y.o. male who [facility] [employer] reporting suicidal ideation related to his drug use. Patient reports that he smoked crack twice in the last 24 hours and also smoked some synthetic cannabinoids. He does not drink a significant amount of alcohol and only drank a small amount once in the past week. He reports suicidal ideation without any intent or plan. No previous suicide attempts. No HI or hallucinations. His SI is very vague and he is unable to elaborate further on the nature of it.     Past Medical History:  Diagnosis Date  . Drug abuse      Patient Active Problem List   Diagnosis Date Noted  . Cocaine abuse 05/30/2016     Past Surgical History:  Procedure Laterality Date  . FINGER SURGERY       Prior to Admission medications   Medication Sig Start Date End Date Taking? Authorizing Provider  hydrocerin (EUCERIN) CREA Apply 1 application topically daily as needed (For dry skin.).    Historical Provider, MD     Allergies Review of patient's allergies indicates no known allergies.   History reviewed. No pertinent family history.  Social History Social History  Substance Use Topics  . Smoking status: Current Every Day Smoker    Packs/day: 1.00    Types: Cigarettes  . Smokeless tobacco: Never Used  . Alcohol use 6.0 oz/week    10 Cans of beer per week    Review of Systems  Constitutional:   No fever or chills.  ENT:   No sore throat. No rhinorrhea. Cardiovascular:   No chest pain. Respiratory:   No dyspnea or cough. Gastrointestinal:   Negative for abdominal pain, vomiting and diarrhea.  Neurological:   Negative for  headaches 10-point ROS otherwise negative.  ____________________________________________   PHYSICAL EXAM:  VITAL SIGNS: ED Triage Vitals [07/11/16 0850]  Enc Vitals Group     BP 129/87     Pulse Rate 97     Resp 18     Temp 98.2 F (36.8 C)     Temp Source Oral     SpO2 96 %     Weight 185 lb (83.9 kg)     Height 5\' 10"  (1.778 m)     Head Circumference      Peak Flow      Pain Score      Pain Loc      Pain Edu?      Excl. in GC?     Vital signs reviewed, nursing assessments reviewed.   Constitutional:   Alert and oriented. Well appearing and in no distress. Eyes:   No scleral icterus. No conjunctival pallor. PERRL. EOMI.  No nystagmus. ENT   Head:   Normocephalic and atraumatic.   Nose:   No congestion/rhinnorhea. No septal hematoma   Mouth/Throat:   MMM, no pharyngeal erythema. No peritonsillar mass.    Neck:   No stridor. No SubQ emphysema. No meningismus. Hematological/Lymphatic/Immunilogical:   No cervical lymphadenopathy. Cardiovascular:   RRR. Symmetric bilateral radial and DP pulses.  No murmurs.  Respiratory:   Normal respiratory effort without tachypnea nor retractions. Breath sounds are clear and equal bilaterally. No wheezes/rales/rhonchi. Gastrointestinal:   Soft and nontender.  Non distended. There is no CVA tenderness.  No rebound, rigidity, or guarding. Genitourinary:   deferred Musculoskeletal:   Nontender with normal range of motion in all extremities. No joint effusions.  No lower extremity tenderness.  No edema. Neurologic:   Normal speech and language.  CN 2-10 normal. Motor grossly intact. No gross focal neurologic deficits are appreciated.  Skin:    Skin is warm, dry and intact. No rash noted.  No petechiae, purpura, or bullae.  ____________________________________________    LABS (pertinent positives/negatives) (all labs ordered are listed, but only abnormal results are displayed) Labs Reviewed  COMPREHENSIVE METABOLIC PANEL -  Abnormal; Notable for the following:       Result Value   Glucose, Bld 117 (*)    Creatinine, Ser 1.25 (*)    Total Protein 8.9 (*)    All other components within normal limits  ACETAMINOPHEN LEVEL - Abnormal; Notable for the following:    Acetaminophen (Tylenol), Serum <10 (*)    All other components within normal limits  CBC - Abnormal; Notable for the following:    WBC 16.0 (*)    All other components within normal limits  URINE DRUG SCREEN, QUALITATIVE ([facility] ONLY) - Abnormal; Notable for the following:    Cocaine Metabolite,Ur [place] POSITIVE (*)    All other components within normal limits  ETHANOL  SALICYLATE LEVEL   ____________________________________________   EKG    ____________________________________________    RADIOLOGY    ____________________________________________   PROCEDURES Procedures  ____________________________________________   INITIAL IMPRESSION / ASSESSMENT AND PLAN / ED COURSE  Pertinent labs & imaging results that were available during my care of the patient were reviewed by me and considered in my medical decision making (see chart for details).  Patient well-appearing no distress. Presents with dysphoria which appears to be mostly related to his dissatisfaction with his ongoing drug use. Patient suggests that he is amenable to inpatient rehabilitation as he did not have a good result from an outpatient program that he attempted 2 weeks ago. He reports that he left after one day because it was just a "boarding house" where he went to a meeting once a day. TTS consult completed, unfortunately there are no inpatient detox resources available to the patient at this time. He is medically stable, psychiatrically stable, don't think he is an imminent danger to himself or others and meets no commitment criteria. Patient given outpatient resources and information, we'll discharge home. Counseled the patient about the dangers of drug use, with which he is  aware.     Clinical Course   ____________________________________________   FINAL CLINICAL IMPRESSION(S) / ED DIAGNOSES  Final diagnoses:  Polysubstance abuse       Portions of this note were generated with dragon dictation software. Dictation errors may occur despite best attempts at proofreading.    Sharman CheekPhillip Shekira Drummer, MD 07/11/16 1118

## 2016-12-01 ENCOUNTER — Emergency Department
Admission: EM | Admit: 2016-12-01 | Discharge: 2016-12-01 | Disposition: A | Discharge status: Home / Self Care | Payer: Self-pay | Attending: Emergency Medicine | Admitting: Emergency Medicine

## 2016-12-01 DIAGNOSIS — R05 Cough: Secondary | ICD-10-CM | POA: Insufficient documentation

## 2016-12-01 DIAGNOSIS — F17210 Nicotine dependence, cigarettes, uncomplicated: Secondary | ICD-10-CM | POA: Insufficient documentation

## 2016-12-01 DIAGNOSIS — R52 Pain, unspecified: Secondary | ICD-10-CM | POA: Insufficient documentation

## 2016-12-01 DIAGNOSIS — R0981 Nasal congestion: Secondary | ICD-10-CM | POA: Insufficient documentation

## 2016-12-01 DIAGNOSIS — R69 Illness, unspecified: Secondary | ICD-10-CM

## 2016-12-01 DIAGNOSIS — J111 Influenza due to unidentified influenza virus with other respiratory manifestations: Secondary | ICD-10-CM

## 2016-12-01 LAB — INFLUENZA PANEL BY PCR (TYPE A & B)
INFLAPCR: NEGATIVE
Influenza B By PCR: NEGATIVE

## 2016-12-01 MED ORDER — KETOROLAC TROMETHAMINE 60 MG/2ML IM SOLN
60.0000 mg | Freq: Once | INTRAMUSCULAR | Status: AC
  Administered 2016-12-01: 60 mg via INTRAMUSCULAR
  Filled 2016-12-01: qty 2

## 2016-12-01 MED ORDER — IBUPROFEN 600 MG PO TABS: 600.0000 mg | ORAL_TABLET | Freq: Three times a day (TID) | ORAL | 0 refills | Status: DC | PRN

## 2016-12-01 MED ORDER — PSEUDOEPH-BROMPHEN-DM 30-2-10 MG/5ML PO SYRP: 5.0000 mL | ORAL_SOLUTION | Freq: Four times a day (QID) | ORAL | 0 refills | Status: DC | PRN

## 2016-12-01 NOTE — ED Provider Notes (Signed)
[facility] Emergency Department Provider Note   ____________________________________________   First MD Initiated Contact with Patient 12/01/16 1426     (approximate)  I have reviewed the triage vital signs and the nursing notes.   HISTORY  Chief Complaint Influenza    HPI Dakota Johnson is a 51 y.o. male patient complaining of cough, nasal congestion, and body aches for 1 week. He states cough is nonproductive. Patient rates his pain as a 5/10. Patient described pain as "achy". No palliative measures taken this complaint. Patient has not taken flu shot this season.   Past Medical History:  Diagnosis Date  . Drug abuse     Patient Active Problem List   Diagnosis Date Noted  . Cocaine abuse 05/30/2016    Past Surgical History:  Procedure Laterality Date  . FINGER SURGERY      Prior to Admission medications   Medication Sig Start Date End Date Taking? Authorizing Provider  brompheniramine-pseudoephedrine-DM 30-2-10 MG/5ML syrup Take 5 mLs by mouth 4 (four) times daily as needed. 12/01/16   Joni Reining, PA-C  hydrocerin (EUCERIN) CREA Apply 1 application topically daily as needed (For dry skin.).    Historical Provider, MD  ibuprofen (ADVIL,MOTRIN) 600 MG tablet Take 1 tablet (600 mg total) by mouth every 8 (eight) hours as needed. 12/01/16   Joni Reining, PA-C    Allergies Patient has no known allergies.  No family history on file.  Social History Social History  Substance Use Topics  . Smoking status: Current Every Day Smoker    Packs/day: 1.00    Types: Cigarettes  . Smokeless tobacco: Never Used  . Alcohol use 6.0 oz/week    10 Cans of beer per week    Review of Systems Constitutional: No fever/chills Eyes: No visual changes. ENT: No sore throat. Cardiovascular: Denies chest pain. Respiratory: Denies shortness of breath. Gastrointestinal: No abdominal pain.  No nausea, no vomiting.  No diarrhea.  No  constipation. Genitourinary: Negative for dysuria. Musculoskeletal: Negative for back pain. Skin: Negative for rash. Neurological: Negative for headaches, focal weakness or numbness. Psychiatric:Drug abuse  ____________________________________________   PHYSICAL EXAM:  VITAL SIGNS: ED Triage Vitals  Enc Vitals Group     BP 12/01/16 1325 106/75     Pulse Rate 12/01/16 1325 91     Resp 12/01/16 1325 18     Temp 12/01/16 1325 98 F (36.7 C)     Temp Source 12/01/16 1325 Oral     SpO2 12/01/16 1325 97 %     Weight 12/01/16 1326 163 lb (73.9 kg)     Height 12/01/16 1326 5\' 10"  (1.778 m)     Head Circumference --      Peak Flow --      Pain Score 12/01/16 1326 5     Pain Loc --      Pain Edu? --      Excl. in GC? --     Constitutional: Alert and oriented. Well appearing and in no acute distress. Eyes: Conjunctivae are normal. PERRL. EOMI. Head: Atraumatic. Nose: No congestion/rhinnorhea. Mouth/Throat: Mucous membranes are moist.  Oropharynx non-erythematous. Neck: No stridor.  No cervical spine tenderness to palpation. Hematological/Lymphatic/Immunilogical: No cervical lymphadenopathy. Cardiovascular: Normal rate, regular rhythm. Grossly normal heart sounds.  Good peripheral circulation. Respiratory: Normal respiratory effort.  No retractions. Lungs CTAB. Gastrointestinal: Soft and nontender. No distention. No abdominal bruits. No CVA tenderness. Musculoskeletal: No lower extremity tenderness nor edema.  No joint effusions. Neurologic:  Normal  speech and language. No gross focal neurologic deficits are appreciated. No gait instability. Skin:  Skin is warm, dry and intact. No rash noted. Psychiatric: Mood and affect are normal. Speech and behavior are normal.  ____________________________________________   LABS (all labs ordered are listed, but only abnormal results are displayed)  Labs Reviewed  INFLUENZA PANEL BY PCR (TYPE A & B)    ____________________________________________  EKG   ____________________________________________  RADIOLOGY   ____________________________________________   PROCEDURES  Procedure(s) performed: None  Procedures  Critical Care performed: No  ____________________________________________   INITIAL IMPRESSION / ASSESSMENT AND PLAN / ED COURSE  Pertinent labs & imaging results that were available during my care of the patient were reviewed by me and considered in my medical decision making (see chart for details).   Viral illness.  Patient was negative for influenza A and B. Patient given discharge Instructions. Patient given prescription for Bromfed DM and ibuprofen. Patient [facility].      ____________________________________________   FINAL CLINICAL IMPRESSION(S) / ED DIAGNOSES  Final diagnoses:  Influenza-like illness      NEW MEDICATIONS STARTED DURING THIS VISIT:  New Prescriptions   BROMPHENIRAMINE-PSEUDOEPHEDRINE-DM 30-2-10 MG/5ML SYRUP    Take 5 mLs by mouth 4 (four) times daily as needed.   IBUPROFEN (ADVIL,MOTRIN) 600 MG TABLET    Take 1 tablet (600 mg total) by mouth every 8 (eight) hours as needed.     Note:  This document was prepared using Dragon voice recognition software and may include unintentional dictation errors.    Joni Reiningonald K Smith, PA-C 12/01/16 1433    Joni Reiningonald K Smith, PA-C 12/01/16 1436    Arnaldo NatalPaul F Malinda, MD 12/01/16 [phone]

## 2016-12-01 NOTE — ED Notes (Signed)
See triage note  States he has had body aches with cough and congestion for couple of days  Afebrile on arrival

## 2016-12-01 NOTE — ED Triage Notes (Signed)
Pt c/o cough with congestion and bodyaches for the past week.

## 2019-04-23 ENCOUNTER — Other Ambulatory Visit: Payer: Self-pay

## 2019-04-23 ENCOUNTER — Emergency Department: Payer: Medicaid Other

## 2019-04-23 ENCOUNTER — Emergency Department
Admission: EM | Admit: 2019-04-23 | Discharge: 2019-04-23 | Disposition: A | Discharge status: Home / Self Care | Payer: Self-pay | Attending: Emergency Medicine | Admitting: Emergency Medicine

## 2019-04-23 DIAGNOSIS — Y92008 Other place in unspecified non-institutional (private) residence as the place of occurrence of the external cause: Secondary | ICD-10-CM | POA: Insufficient documentation

## 2019-04-23 DIAGNOSIS — T07XXXA Unspecified multiple injuries, initial encounter: Secondary | ICD-10-CM

## 2019-04-23 DIAGNOSIS — S0003XA Contusion of scalp, initial encounter: Secondary | ICD-10-CM | POA: Insufficient documentation

## 2019-04-23 DIAGNOSIS — Z23 Encounter for immunization: Secondary | ICD-10-CM | POA: Insufficient documentation

## 2019-04-23 DIAGNOSIS — Y999 Unspecified external cause status: Secondary | ICD-10-CM | POA: Insufficient documentation

## 2019-04-23 DIAGNOSIS — Y939 Activity, unspecified: Secondary | ICD-10-CM | POA: Insufficient documentation

## 2019-04-23 DIAGNOSIS — F17210 Nicotine dependence, cigarettes, uncomplicated: Secondary | ICD-10-CM | POA: Insufficient documentation

## 2019-04-23 DIAGNOSIS — S0990XA Unspecified injury of head, initial encounter: Secondary | ICD-10-CM | POA: Insufficient documentation

## 2019-04-23 MED ORDER — TETANUS-DIPHTH-ACELL PERTUSSIS 5-2.5-18.5 LF-MCG/0.5 IM SUSP
0.5000 mL | Freq: Once | INTRAMUSCULAR | Status: AC
  Administered 2019-04-23: 0.5 mL via INTRAMUSCULAR
  Filled 2019-04-23: qty 0.5

## 2019-04-23 MED ORDER — HYDROCODONE-ACETAMINOPHEN 5-325 MG PO TABS
1.0000 | ORAL_TABLET | Freq: Once | ORAL | Status: AC
  Administered 2019-04-23: 1 via ORAL
  Filled 2019-04-23: qty 1
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
## 2019-04-23 NOTE — ED Notes (Signed)
Pt returned from ct
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
## 2019-04-23 NOTE — Discharge Instructions (Signed)
1.  Apply ice to affected area several times daily. 2.  You may take Tylenol and/or Ibuprofen as needed for pain. 3.  Return to the ER for worsening symptoms, persistent vomiting, difficulty breathing or other concerns.

## 2019-06-24 ENCOUNTER — Emergency Department
Admission: EM | Admit: 2019-06-24 | Discharge: 2019-06-24 | Disposition: A | Discharge status: Home / Self Care | Payer: HRSA Program | Attending: Emergency Medicine | Admitting: Emergency Medicine

## 2019-06-24 ENCOUNTER — Other Ambulatory Visit: Payer: Self-pay

## 2019-06-24 ENCOUNTER — Encounter: Payer: Self-pay | Admitting: Emergency Medicine

## 2019-06-24 ENCOUNTER — Emergency Department: Payer: HRSA Program

## 2019-06-24 DIAGNOSIS — R05 Cough: Secondary | ICD-10-CM | POA: Diagnosis not present

## 2019-06-24 DIAGNOSIS — Z008 Encounter for other general examination: Secondary | ICD-10-CM

## 2019-06-24 DIAGNOSIS — B20 Human immunodeficiency virus [HIV] disease: Secondary | ICD-10-CM | POA: Insufficient documentation

## 2019-06-24 DIAGNOSIS — F17210 Nicotine dependence, cigarettes, uncomplicated: Secondary | ICD-10-CM | POA: Insufficient documentation

## 2019-06-24 DIAGNOSIS — J189 Pneumonia, unspecified organism: Secondary | ICD-10-CM | POA: Insufficient documentation

## 2019-06-24 DIAGNOSIS — F1410 Cocaine abuse, uncomplicated: Secondary | ICD-10-CM | POA: Diagnosis not present

## 2019-06-24 DIAGNOSIS — Z20828 Contact with and (suspected) exposure to other viral communicable diseases: Secondary | ICD-10-CM | POA: Insufficient documentation

## 2019-06-24 HISTORY — DX: Human immunodeficiency virus (HIV) disease: B20

## 2019-06-24 HISTORY — DX: Asymptomatic human immunodeficiency virus (hiv) infection status: Z21

## 2019-06-24 LAB — SARS CORONAVIRUS 2 BY RT PCR (HOSPITAL ORDER, PERFORMED IN ~~LOC~~ HOSPITAL LAB): SARS Coronavirus 2: NEGATIVE

## 2019-06-24 MED ORDER — SULFAMETHOXAZOLE-TRIMETHOPRIM 800-160 MG PO TABS
1.0000 | ORAL_TABLET | Freq: Once | ORAL | Status: AC
  Administered 2019-06-24: 18:00:00 1 via ORAL
  Filled 2019-06-24: qty 1

## 2019-06-24 MED ORDER — SULFAMETHOXAZOLE-TRIMETHOPRIM 800-160 MG PO TABS: 1.0000 | ORAL_TABLET | Freq: Two times a day (BID) | ORAL | 0 refills | Status: AC
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
## 2019-06-24 NOTE — ED Notes (Signed)
Pt reports cough, denies any n/v/d or fever
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
## 2019-06-24 NOTE — ED Triage Notes (Signed)
Arrives for medical clearance for jail.  Officer states patient needs a COVID test.  States patient reports testing positive for COVID in  March.

## 2019-06-24 NOTE — Discharge Instructions (Addendum)
Patient has a negative COVID-19 test.  However he does have a right middle lobe pneumonia.  He will be started on Septra DS 1 twice daily.  Prescription has been sent with the officer. Patient is to return to the emergency department if worsening.
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
## 2020-02-17 IMAGING — DX PORTABLE CHEST - 1 VIEW
2 series · 2 of 2 positions shown · non-contrast
Comparison: None.

CLINICAL DATA: Cough.

EXAM:
PORTABLE CHEST 1 VIEW

[chest ap (1 of 2)]
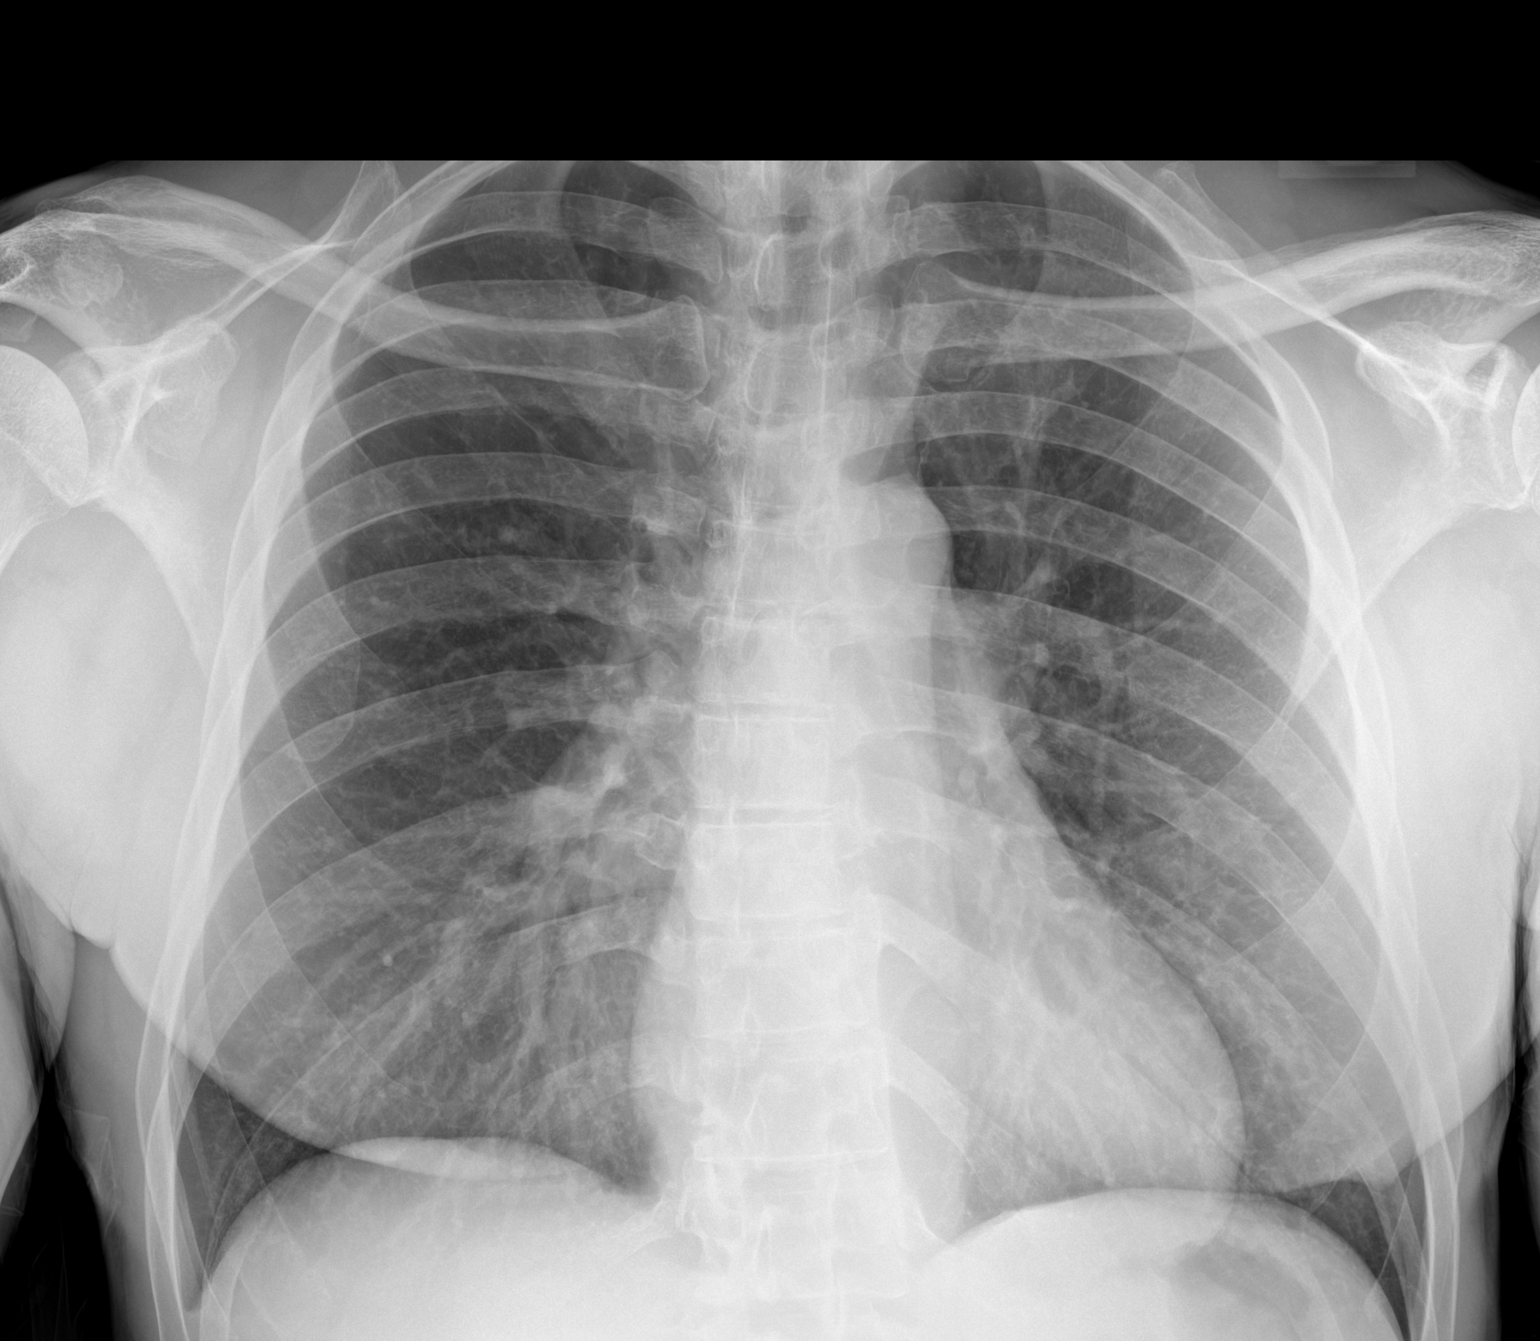

[chest ap (2 of 2)]
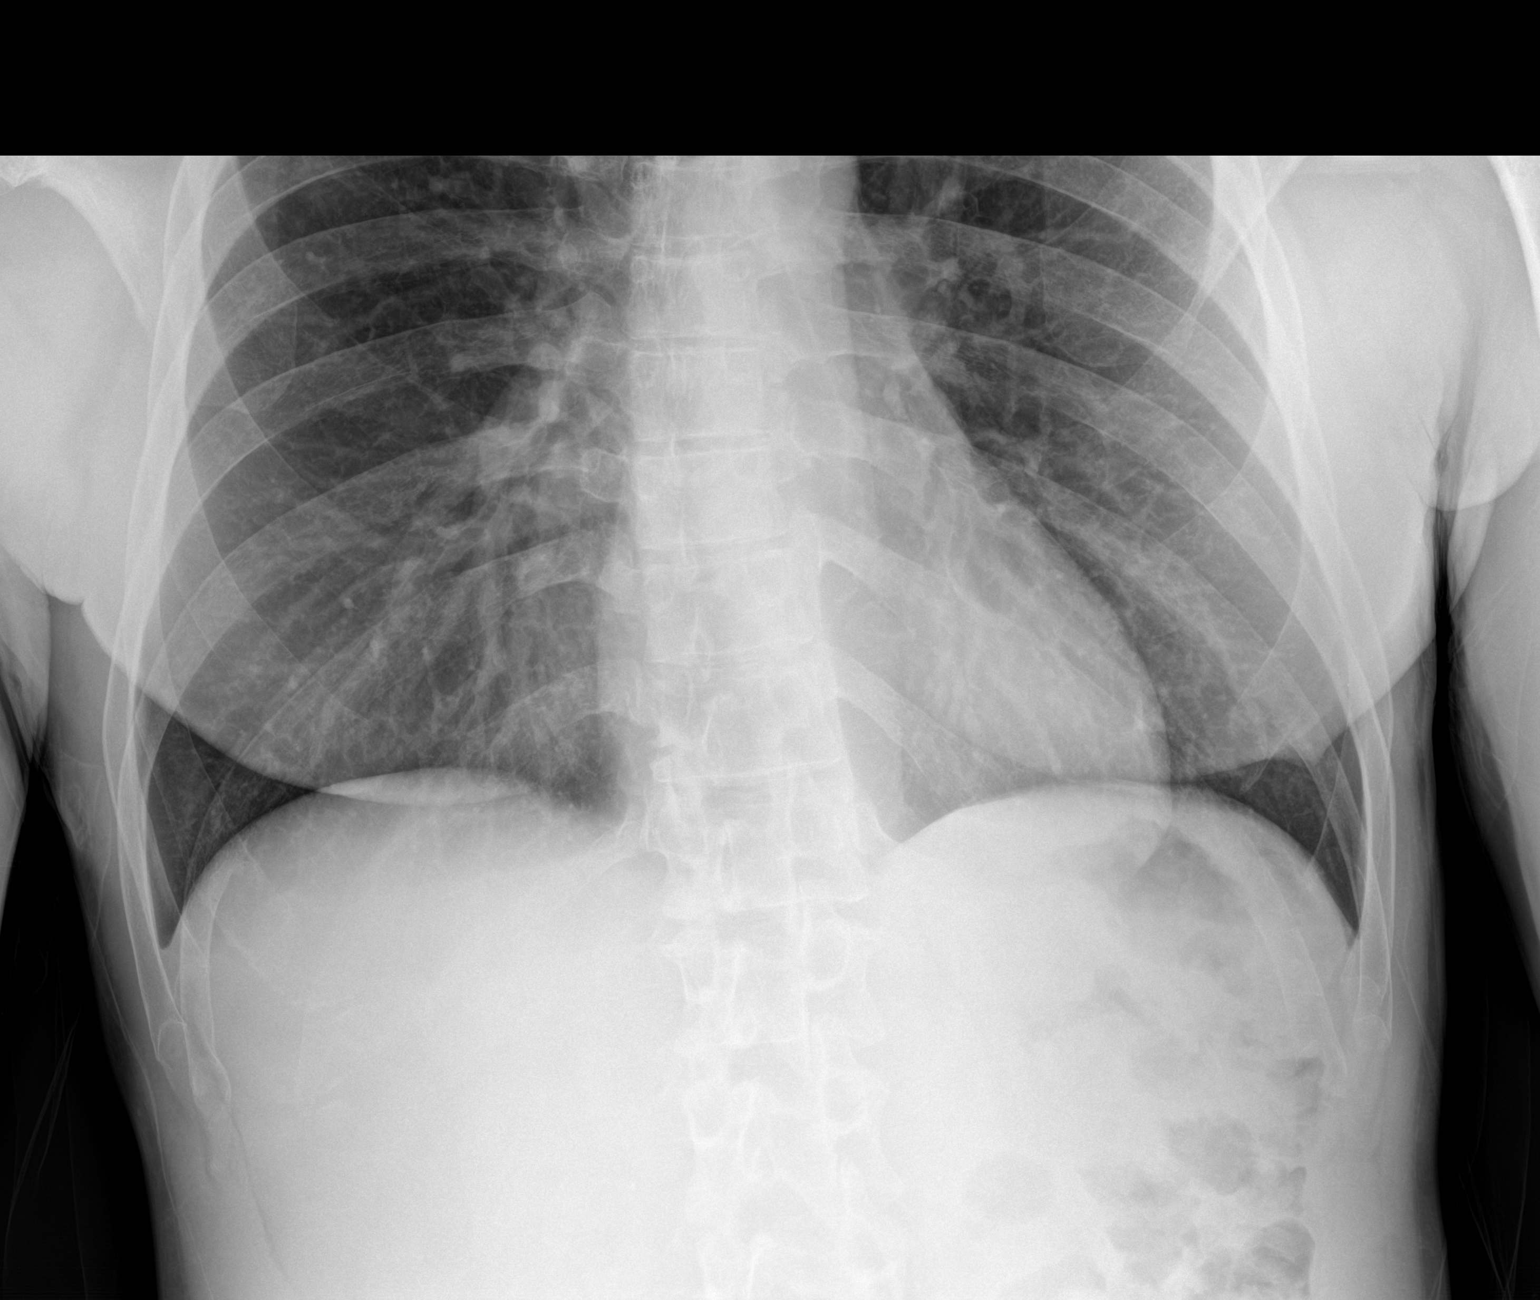

[2 of 2 positions shown; findings below may reference images not displayed]

FINDINGS: The cardiomediastinal silhouette is unremarkable.

MEDIAL RIGHT LOWER lung opacity may represent airspace
disease/pneumonia.

No evidence of pleural effusion, pneumothorax, mass or acute bony
abnormality.
IMPRESSION: Question MEDIAL RIGHT LOWER lung airspace disease/pneumonia.

## 2021-10-29 ENCOUNTER — Other Ambulatory Visit: Payer: Self-pay

## 2021-10-29 DIAGNOSIS — B20 Human immunodeficiency virus [HIV] disease: Secondary | ICD-10-CM

## 2021-10-29 DIAGNOSIS — Z113 Encounter for screening for infections with a predominantly sexual mode of transmission: Secondary | ICD-10-CM

## 2021-10-29 DIAGNOSIS — Z79899 Other long term (current) drug therapy: Secondary | ICD-10-CM

## 2021-11-04 ENCOUNTER — Ambulatory Visit: Payer: Medicaid Other

## 2021-11-04 ENCOUNTER — Other Ambulatory Visit: Payer: Medicaid Other

## 2021-11-10 ENCOUNTER — Encounter: Payer: Self-pay | Admitting: Internal Medicine

## 2021-11-10 ENCOUNTER — Ambulatory Visit: Payer: Self-pay

## 2021-11-10 ENCOUNTER — Other Ambulatory Visit: Payer: Self-pay

## 2021-11-11 ENCOUNTER — Other Ambulatory Visit: Payer: Self-pay

## 2021-11-18 ENCOUNTER — Encounter: Payer: Medicaid Other | Admitting: Internal Medicine

## 2021-11-18 ENCOUNTER — Ambulatory Visit: Payer: Self-pay | Admitting: Pharmacist

## 2021-12-31 ENCOUNTER — Other Ambulatory Visit: Payer: Self-pay | Admitting: Pharmacist

## 2021-12-31 DIAGNOSIS — B20 Human immunodeficiency virus [HIV] disease: Secondary | ICD-10-CM

## 2021-12-31 MED ORDER — BICTEGRAVIR-EMTRICITAB-TENOFOV 50-200-25 MG PO TABS: 1.0000 | ORAL_TABLET | Freq: Every day | ORAL | 0 refills | Status: AC

## 2021-12-31 NOTE — Progress Notes (Signed)
Medication Samples have been provided to the patient. ° °Drug name: Biktarvy        °Strength: 50/200/25 mg       °Qty: 21 tablets (3 bottles) °LOT: [id number]   °Exp.Date: 10/24 ° °Dosing instructions: Take one tablet by mouth once daily ° °The patient has been instructed regarding the correct time, dose, and frequency of taking this medication, including desired effects and most common side effects.  ° °Andras Grunewald, PharmD, CPP °Clinical Pharmacist Practitioner °Infectious Diseases Clinical Pharmacist °[facility] ° °

## 2022-01-04 ENCOUNTER — Other Ambulatory Visit: Payer: Self-pay

## 2022-01-04 DIAGNOSIS — B20 Human immunodeficiency virus [HIV] disease: Secondary | ICD-10-CM

## 2022-01-04 DIAGNOSIS — Z113 Encounter for screening for infections with a predominantly sexual mode of transmission: Secondary | ICD-10-CM

## 2022-01-08 ENCOUNTER — Other Ambulatory Visit: Payer: Self-pay

## 2022-02-03 ENCOUNTER — Ambulatory Visit: Payer: Self-pay | Admitting: Internal Medicine

## 2022-02-03 ENCOUNTER — Ambulatory Visit: Payer: Self-pay | Admitting: Pharmacist

## 2022-02-24 ENCOUNTER — Ambulatory Visit: Payer: Self-pay | Admitting: Pharmacist

## 2022-03-08 ENCOUNTER — Ambulatory Visit (INDEPENDENT_AMBULATORY_CARE_PROVIDER_SITE_OTHER): Payer: Self-pay | Admitting: Internal Medicine

## 2022-03-08 ENCOUNTER — Other Ambulatory Visit: Payer: Self-pay

## 2022-03-08 ENCOUNTER — Ambulatory Visit (INDEPENDENT_AMBULATORY_CARE_PROVIDER_SITE_OTHER): Payer: Self-pay | Admitting: Pharmacist

## 2022-03-08 ENCOUNTER — Encounter: Payer: Self-pay | Admitting: Internal Medicine

## 2022-03-08 VITALS — BP 119/68 | HR 64 | Temp 97.8°F | Resp 16 | Ht 70.0 in | Wt 187.6 lb

## 2022-03-08 DIAGNOSIS — B20 Human immunodeficiency virus [HIV] disease: Secondary | ICD-10-CM

## 2022-03-08 MED ORDER — BICTEGRAVIR-EMTRICITAB-TENOFOV 50-200-25 MG PO TABS: 1.0000 | ORAL_TABLET | Freq: Every day | ORAL | 5 refills | Status: DC

## 2022-03-08 MED ORDER — NICOTINE POLACRILEX 4 MG MT GUM: 4.0000 mg | CHEWING_GUM | OROMUCOSAL | 0 refills | Status: DC | PRN

## 2022-03-08 NOTE — Addendum Note (Signed)
Addended byDanelle Earthly on: 03/08/2022 04:27 PM ? ? Modules accepted: Level of Service ? ?

## 2022-03-08 NOTE — Progress Notes (Signed)
?   ? ? ? ? ? ? ? ? ? ?HPI: Dakota Johnson is a 56 y.o. male with HIV presents to establish care. He is on [employer]. Released from Incarceration on 11/01/21. Last seen by Venora Maples NP at [facility] on /19/22. Last dose of Biktarvy 2 weeks ago. Prior to this reports 100% adherence.  ?He was incarcerated for 19 months. 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  ?Anal sex none ?Oral sex, contraception no ?Vaginal penile sex, contraception no ? ?Social: ?Occupation: CArchitect?Housing:House with mother ?Support: Mom/Co-workers. Speaks to family with HIV ?Etoh/drug/tobacco use:  social/Hx of smoking crack/cocaine 2 weeks ago/ current smoker ? ?Past Medical History:  ?Diagnosis Date  ? Drug abuse ([place]   ? HIV (human immunodeficiency virus infection) ([place]   ?  ?Past Surgical History:  ?Procedure Laterality Date  ? FINGER SURGERY    ?  ?No family history on file. ?Current Outpatient Medications on File Prior to Visit  ?Medication Sig Dispense Refill  ? brompheniramine-pseudoephedrine-DM 30-2-10 MG/5ML syrup Take 5 mLs by mouth 4 (four) times daily as needed. 120 mL 0  ? hydrocerin (EUCERIN) CREA Apply 1 application topically daily as needed (For dry skin.).    ? ibuprofen (ADVIL,MOTRIN) 600 MG tablet Take 1 tablet (600 mg total) by mouth every 8 (eight) hours as needed. 15 tablet 0  ? sulfamethoxazole-trimethoprim (BACTRIM DS) 800-160 MG tablet Take 1 tablet by mouth 2 (two) times daily. 20 tablet 0  ? ?No current facility-administered medications on file prior to visit.  ?  ?No Known Allergies ?Lab Results ?No results found for: HIV1RNAQUANT, HIV1RNAVL, CD4TABS ?No results found for: HIV1GENOSEQ ?Lab Results  ?Component Value Date  ? WBC 16.0 (H) 07/11/2016  ? HGB 16.5  07/11/2016  ? HCT 47.9 07/11/2016  ? MCV 92.2 07/11/2016  ? PLT 275 07/11/2016  ?  ?Lab Results  ?Component Value Date  ? CREATININE 1.25 (H) 07/11/2016  ? BUN 12 07/11/2016  ? NA 137 07/11/2016  ? K 3.7 07/11/2016  ? CL 103 07/11/2016  ? CO2 28 07/11/2016  ? ?Lab Results  ?Component Value Date  ? ALT 23 07/11/2016  ? AST 33 07/11/2016  ? ALKPHOS 65 07/11/2016  ? BILITOT 1.2 07/11/2016  ?  ?No results found for: CHOL, TRIG, HDL, LDLCALC ?No results found for: HAV ?No results found for: HEPBSAG, HEPBSAB ?No results found for: HCVAB ?No results found for: CHLAMYDIAWP, N ?No results found for: GCPROBEAPT ?No results found for: QUANTGOLD ?Physical Exam ?Constitutional:   ?   General: He is not in acute distress. ?   Appearance: He is normal weight. He is not toxic-appearing.  ?HENT:  ?   Head: Normocephalic and atraumatic.  ?   Right Ear: External ear normal.  ?   Left Ear: External ear normal.  ?   Nose: No congestion or rhinorrhea.  ?   Mouth/Throat:  ?   Mouth: Mucous membranes are moist.  ?   Pharynx: Oropharynx is clear.  ?Eyes:  ?   Extraocular Movements: Extraocular movements intact.  ?   Conjunctiva/sclera: Conjunctivae normal.  ?   Pupils: Pupils are equal, round, and reactive to light.  ?Cardiovascular:  ?   Rate and Rhythm: Normal rate and regular rhythm.  ?  Heart sounds: No murmur heard. ?  No friction rub. No gallop.  ?Pulmonary:  ?   Effort: Pulmonary effort is normal.  ?   Breath sounds: Normal breath sounds.  ?Abdominal:  ?   General: Abdomen is flat. Bowel sounds are normal.  ?   Palpations: Abdomen is soft.  ?Musculoskeletal:     ?   General: No swelling. Normal range of motion.  ?   Cervical back: Normal range of motion and neck supple.  ?Skin: ?   General: Skin is warm and dry.  ?Neurological:  ?   General: No focal deficit present.  ?   Mental Status: He is oriented to person, place, and time.  ?Psychiatric:     ?   Mood and Affect: Mood normal.  ?  ?Plan ?#HIV/Asymptomatic ?#Hx of intermittent  ART adherence ?-VL 120, CD4 399 on 09/11/21 ?-[facility] healthcare and pt was only seen once. We do have records from [facility] with Alwyn Pea, NP on 08/26/20 and noted to have been off of ART x 1 year ?Plan ?-HIV labs today including HLA [id number] ?-Counseled on medication adherence ?-Continue Biktarvy ?-Dental paperwork ?-Follow-up in  3 months ? ?#Tobacco abuse ?-Counseled on smoking cessation ?-Nicorette gum  ? ? ?#Smoking Crack cocian ?-last use 2 days ago ?-Sober for almost 2 years prior to this ? ?#hx of mildly elevated Scr 1.31 on 09/11/22 ?#Vaccination ?COVID: Moderna 07/09/20, 08/06/20, booster 03/16/21, 10/06/21 ?Flu UTD 08/28/21 ?PCV 12 10/06/20, 23-7/27/22 ?Meningitis-unkown ?HepA/HEpB-Twinrix 10/06/20 11/03/20, 04/06/21 ?Tdap-unknown ?Shingles-unkwon ? ?#Health maintenance ?-Quantiferon order today 03/08/22 ?-RPR order today 03/08/22 ?-HCVorder today 03/08/22 ?-Colonoscopy-discuss at next visit ? ? ? ?Laurice Record, MD ?[facility] for Infectious Disease ?[facility]  ?

## 2022-03-08 NOTE — Progress Notes (Signed)
Met with patient briefly today. He has no problems with Biktarvy and has taken it for along time. Discussed our clinical pharmacy services here and asked that he reach out if he ever needs anything. Also provided him with a pillbox.  ? ?Cassie L. Kuppelweiser, PharmD, BCIDP, AAHIVP, CPP ?Clinical Pharmacist Practitioner ?Infectious Diseases Clinical Pharmacist ?[facility] ?03/08/2022, 4:07 PM ? ?

## 2022-03-09 LAB — T-HELPER CELLS (CD4) COUNT (NOT AT ARMC)
CD4 % Helper T Cell: 34 % (ref 33–65)
CD4 T Cell Abs: 446 /uL (ref 400–1790)

## 2022-03-09 LAB — CYTOLOGY, (ORAL, ANAL, URETHRAL) ANCILLARY ONLY
Chlamydia: NEGATIVE
Chlamydia: NEGATIVE
Comment: NEGATIVE
Comment: NEGATIVE
Comment: NORMAL
Comment: NORMAL
Neisseria Gonorrhea: NEGATIVE
Neisseria Gonorrhea: NEGATIVE

## 2022-03-09 LAB — URINE CYTOLOGY ANCILLARY ONLY
Chlamydia: NEGATIVE
Comment: NEGATIVE
Comment: NORMAL
Neisseria Gonorrhea: NEGATIVE

## 2022-03-19 LAB — HIV-1/2 AB - DIFFERENTIATION
HIV-1 antibody: POSITIVE — AB
HIV-2 Ab: NEGATIVE

## 2022-03-19 LAB — LIPID PANEL
Cholesterol: 161 mg/dL (ref <200)
HDL: 44 mg/dL (ref >=40)
LDL Cholesterol (Calc): 98 mg/dL (calc)
Non-HDL Cholesterol (Calc): 117 mg/dL (calc) (ref <130)
Total CHOL/HDL Ratio: 3.7 (calc) (ref <5.0)
Triglycerides: 98 mg/dL (ref <150)

## 2022-03-19 LAB — HIV-1 INTEGRASE GENOTYPE

## 2022-03-19 LAB — HLA B*5701: HLA-B*5701 w/rflx HLA-B High: NEGATIVE

## 2022-03-19 LAB — HIV RNA, RTPCR W/R GT (RTI, PI,INT)
HIV 1 RNA Quant: 487 copies/mL — ABNORMAL HIGH
HIV-1 RNA Quant, Log: 2.69 Log copies/mL — ABNORMAL HIGH

## 2022-03-19 LAB — HEPATITIS B SURFACE ANTIBODY,QUALITATIVE: Hep B S Ab: REACTIVE — AB

## 2022-03-19 LAB — HIV-1 GENOTYPE: HIV-1 Genotype: DETECTED — AB

## 2022-03-19 LAB — HEPATITIS B SURFACE ANTIGEN: Hepatitis B Surface Ag: NONREACTIVE

## 2022-03-19 LAB — QUANTIFERON-TB GOLD PLUS
Mitogen-NIL: >10 IU/mL
NIL: 0.04 IU/mL
QuantiFERON-TB Gold Plus: NEGATIVE
TB1-NIL: <0 IU/mL
TB2-NIL: 0.01 IU/mL

## 2022-03-19 LAB — HEPATITIS A ANTIBODY, TOTAL: Hepatitis A AB,Total: REACTIVE — AB

## 2022-03-19 LAB — HEPATITIS C ANTIBODY
Hepatitis C Ab: NONREACTIVE
SIGNAL TO CUT-OFF: 0.18 (ref <1.00)

## 2022-03-19 LAB — HIV ANTIBODY (ROUTINE TESTING W REFLEX): HIV 1&2 Ab, 4th Generation: REACTIVE — AB

## 2022-03-19 LAB — RPR: RPR Ser Ql: NONREACTIVE

## 2022-03-23 ENCOUNTER — Telehealth: Payer: Self-pay

## 2022-03-23 NOTE — Telephone Encounter (Signed)
Attempted to contact patient - no VM set up to leave a message.   [place], CMA

## 2022-03-23 NOTE — Telephone Encounter (Signed)
-----   Message from Danelle Earthly, MD sent at 03/23/2022  3:28 PM EDT ----- VL 487, elevated with no resistance found on testing. Continue adherence to [employer].

## 2022-03-24 NOTE — Telephone Encounter (Signed)
Second attempt to reach patient, no answer and no voicemail box set up.   Sandie Ano, RN

## 2022-03-26 NOTE — Telephone Encounter (Signed)
Third attempt calling patient - no VM set up to leave a message.    [place], CMA

## 2022-06-08 ENCOUNTER — Ambulatory Visit: Payer: Self-pay | Admitting: Internal Medicine

## 2022-07-22 ENCOUNTER — Other Ambulatory Visit: Payer: Self-pay

## 2022-07-22 ENCOUNTER — Ambulatory Visit: Payer: No Typology Code available for payment source

## 2022-07-26 ENCOUNTER — Ambulatory Visit: Payer: No Typology Code available for payment source

## 2022-07-26 ENCOUNTER — Encounter: Payer: Self-pay | Admitting: Internal Medicine

## 2022-07-26 ENCOUNTER — Other Ambulatory Visit: Payer: Self-pay

## 2022-07-26 ENCOUNTER — Ambulatory Visit (INDEPENDENT_AMBULATORY_CARE_PROVIDER_SITE_OTHER): Payer: No Typology Code available for payment source | Admitting: Internal Medicine

## 2022-07-26 ENCOUNTER — Other Ambulatory Visit (HOSPITAL_COMMUNITY)
Admission: RE | Admit: 2022-07-26 | Discharge: 2022-07-26 | Disposition: A | Discharge status: Home / Self Care | Payer: No Typology Code available for payment source | Source: Ambulatory Visit | Attending: Internal Medicine | Admitting: Internal Medicine

## 2022-07-26 ENCOUNTER — Other Ambulatory Visit (HOSPITAL_COMMUNITY): Payer: Self-pay

## 2022-07-26 ENCOUNTER — Telehealth: Payer: Self-pay

## 2022-07-26 VITALS — BP 105/69 | HR 70 | Temp 98.0°F | Wt 156.0 lb

## 2022-07-26 DIAGNOSIS — B20 Human immunodeficiency virus [HIV] disease: Secondary | ICD-10-CM

## 2022-07-26 DIAGNOSIS — Z23 Encounter for immunization: Secondary | ICD-10-CM

## 2022-07-26 MED ORDER — BICTEGRAVIR-EMTRICITAB-TENOFOV 50-200-25 MG PO TABS: 1.0000 | ORAL_TABLET | Freq: Every day | ORAL | 5 refills | Status: AC

## 2022-07-26 NOTE — Progress Notes (Signed)
[facility]        HPI:  HENCE DERRICK is a 56 y.o. male with HIV presents to establish care. He is on [employer]. Released from Incarceration on 11/01/21. Last seen by Fredna Dow NP at [facility] on /19/22. Last dose of Biktarvy 2 weeks ago. Prior to this reports 100% adherence.  He was incarcerated for 19 months. Prior to this he received his HIV care at [facility], noted to have intermittent adherence to ART Today 10/2: plan was to follow-up in August. Pt has been off of meds since June. He states he ran out and did not pick up meds. 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 Speaks to family with HIV Etoh/drug/tobacco use:  social/Hx of smoking crack/cocaine a week ago/ current smoker Past Medical History:  Diagnosis Date   Drug abuse (HCC)    HIV (human immunodeficiency virus infection) (HCC)     Past Surgical History:  Procedure Laterality Date   FINGER SURGERY      No family history on file. Current Outpatient Medications on File Prior to Visit  Medication Sig Dispense Refill   bictegravir-emtricitabine-tenofovir AF (BIKTARVY) 50-200-25 MG TABS tablet Take 1 tablet by mouth daily. Try to take at the same time each day with or without food. (Patient not taking: Reported on 07/26/2022) 30 tablet 5   brompheniramine-pseudoephedrine-DM 30-2-10 MG/5ML syrup Take 5 mLs by mouth 4 (four) times daily as needed. (Patient not taking: Reported on 03/08/2022) 120 mL 0   hydrocerin (EUCERIN) CREA Apply 1 application topically daily as needed (For dry skin.). (Patient not taking: Reported on 03/08/2022)     ibuprofen (ADVIL,MOTRIN) 600 MG  tablet Take 1 tablet (600 mg total) by mouth every 8 (eight) hours as needed. (Patient not taking: Reported on 03/08/2022) 15 tablet 0   nicotine polacrilex (NICORETTE) 4 MG gum Take 1 each (4 mg total) by mouth as needed for smoking cessation. (Patient not taking: Reported on 07/26/2022) 100 tablet 0   sulfamethoxazole-trimethoprim (BACTRIM DS) 800-160 MG tablet Take 1 tablet by mouth 2 (two) times daily. (Patient not taking: Reported on 03/08/2022) 20 tablet 0   No current facility-administered medications on file prior to visit.    No Known Allergies   Lab Results HIV 1 RNA Quant (copies/mL)  Date Value  03/08/2022 487 (H)   CD4 T Cell Abs (/uL)  Date Value  03/08/2022 446   No results found for: "HIV1GENOSEQ" Lab Results  Component Value Date   WBC 16.0 (H) 07/11/2016   HGB 16.5 07/11/2016   HCT 47.9 07/11/2016   MCV 92.2 07/11/2016   PLT 275 07/11/2016    Lab Results  Component Value Date   CREATININE 1.25 (H) 07/11/2016   BUN 12 07/11/2016   NA 137 07/11/2016   K 3.7 07/11/2016   CL 103 07/11/2016   CO2 28 07/11/2016   Lab Results  Component Value Date   ALT 23 07/11/2016   AST 33 07/11/2016   ALKPHOS 65 07/11/2016   BILITOT 1.2 07/11/2016    Lab Results  Component Value Date   CHOL 161 03/08/2022   TRIG 98 03/08/2022   HDL 44 03/08/2022   LDLCALC  98 03/08/2022   Lab Results  Component Value Date   HAV REACTIVE (A) 03/08/2022   Lab Results  Component Value Date   HEPBSAG NON-REACTIVE 03/08/2022   HEPBSAB REACTIVE (A) 03/08/2022   No results found for: "HCVAB" Lab Results  Component Value Date   CHLAMYDIAWP Negative 03/08/2022   CHLAMYDIAWP Negative 03/08/2022   CHLAMYDIAWP Negative 03/08/2022   N Negative 03/08/2022   N Negative 03/08/2022   N Negative 03/08/2022   No results found for: "GCPROBEAPT" No results found for: "QUANTGOLD"  Plan #HIV/Asymptomatic #ART non- adherence -VL 487, CD4 446 on 03/08/22 Plan -HIV labs today  -Counseled  on medication adherence -Continue Biktarvy -Follow-up in  3 months   #Tobacco abuse -Counseled on smoking cessation -Nicorette gum      #Smoking Crack cocian -last use a week ago. -[facility] intake number provided for rehab.    #hx of mildly elevated Scr 1.31 on 09/11/22 #Vaccination COVID: Moderna 07/09/20, 08/06/20, booster 03/16/21, 10/06/21 Flu today 07/26/22 PCV 12 10/06/20, 23-7/27/22 Meningitis-unkown HepA/HEpB-Twinrix 10/06/20 11/03/20, 04/06/21. HBV and HAV ab postive Tdap-unknown Shingles-unkwon   #Health maintenance -Quantifero negative 03/08/22 -RPR NR 03/08/22, today 07/26/22 -HCV NR/15/23, today 07/26/22 -Colonoscopy-discuss at next visit Laurice Record, [place] [facility]

## 2022-07-26 NOTE — Addendum Note (Signed)
Addended by: Leatrice Jewels on: 07/26/2022 03:32 PM   Modules accepted: Orders

## 2022-07-26 NOTE — Telephone Encounter (Signed)
Attempted to call patient today regarding appointment. Not able to reach him at this time. Call cannot be completed at this time.  Leatrice Jewels, RMA

## 2022-07-27 LAB — T-HELPER CELLS (CD4) COUNT (NOT AT ARMC)
CD4 % Helper T Cell: 25 % — ABNORMAL LOW (ref 33–65)
CD4 T Cell Abs: 274 /uL — ABNORMAL LOW (ref 400–1790)

## 2022-07-28 LAB — COMPLETE METABOLIC PANEL WITH GFR
AG Ratio: 1.1 (calc) (ref 1.0–2.5)
ALT: 14 U/L (ref 9–46)
AST: 17 U/L (ref 10–35)
Albumin: 3.9 g/dL (ref 3.6–5.1)
Alkaline phosphatase (APISO): 59 U/L (ref 35–144)
BUN: 15 mg/dL (ref 7–25)
CO2: 24 mmol/L (ref 20–32)
Calcium: 8.8 mg/dL (ref 8.6–10.3)
Chloride: 106 mmol/L (ref 98–110)
Creat: 0.98 mg/dL (ref 0.70–1.30)
Globulin: 3.5 g/dL (calc) (ref 1.9–3.7)
Glucose, Bld: 106 mg/dL — ABNORMAL HIGH (ref 65–99)
Potassium: 4.3 mmol/L (ref 3.5–5.3)
Sodium: 139 mmol/L (ref 135–146)
Total Bilirubin: 0.4 mg/dL (ref 0.2–1.2)
Total Protein: 7.4 g/dL (ref 6.1–8.1)
eGFR: 90 mL/min/{1.73_m2} (ref >=60)

## 2022-07-28 LAB — URINALYSIS, ROUTINE W REFLEX MICROSCOPIC
Bilirubin Urine: NEGATIVE
Glucose, UA: NEGATIVE
Hgb urine dipstick: NEGATIVE
Ketones, ur: NEGATIVE
Leukocytes,Ua: NEGATIVE
Nitrite: NEGATIVE
Protein, ur: NEGATIVE
Specific Gravity, Urine: 1.022 (ref 1.001–1.035)
pH: 6 (ref 5.0–8.0)

## 2022-07-28 LAB — CBC WITH DIFFERENTIAL/PLATELET
Absolute Monocytes: 480 cells/uL (ref 200–950)
Basophils Absolute: 29 cells/uL (ref 0–200)
Basophils Relative: 0.6 %
Eosinophils Absolute: 110 cells/uL (ref 15–500)
Eosinophils Relative: 2.3 %
HCT: 40.5 % (ref 38.5–50.0)
Hemoglobin: 13.7 g/dL (ref 13.2–17.1)
Lymphs Abs: 1142 cells/uL (ref 850–3900)
MCH: 31.9 pg (ref 27.0–33.0)
MCHC: 33.8 g/dL (ref 32.0–36.0)
MCV: 94.2 fL (ref 80.0–100.0)
MPV: 8.9 fL (ref 7.5–12.5)
Monocytes Relative: 10 %
Neutro Abs: 3038 cells/uL (ref 1500–7800)
Neutrophils Relative %: 63.3 %
Platelets: 217 10*3/uL (ref 140–400)
RBC: 4.3 10*6/uL (ref 4.20–5.80)
RDW: 12.4 % (ref 11.0–15.0)
Total Lymphocyte: 23.8 %
WBC: 4.8 10*3/uL (ref 3.8–10.8)

## 2022-07-28 LAB — URINE CYTOLOGY ANCILLARY ONLY
Chlamydia: NEGATIVE
Comment: NEGATIVE
Comment: NORMAL
Neisseria Gonorrhea: NEGATIVE

## 2022-07-28 LAB — RPR: RPR Ser Ql: NONREACTIVE

## 2022-07-28 LAB — HIV-1 RNA QUANT-NO REFLEX-BLD
HIV 1 RNA Quant: 10100 Copies/mL — ABNORMAL HIGH
HIV-1 RNA Quant, Log: 4 Log cps/mL — ABNORMAL HIGH

## 2022-07-28 LAB — HEPATITIS C ANTIBODY: Hepatitis C Ab: NONREACTIVE

## 2022-08-09 ENCOUNTER — Telehealth: Payer: Self-pay

## 2022-08-09 NOTE — Telephone Encounter (Signed)
Called patient to relay results per provider. Phone rang but message stated, "Call could not be completed at this time". Voicemail not set up - unable to leave message.  Binnie Kand, RN

## 2022-08-09 NOTE — Telephone Encounter (Signed)
-----   Message from Laurice Record, MD sent at 08/06/2022  2:34 PM EDT ----- Viral load is increasing and cd4 trending down, this is likely reflective of medication non-adherence. Counsel on medication adherence.

## 2022-08-13 NOTE — Telephone Encounter (Signed)
Attempted to contact patient today and unable to leave a message

## 2022-08-18 NOTE — Telephone Encounter (Signed)
Letter mailed to patient, requesting call back to office.  Binnie Kand, RN

## 2022-09-08 ENCOUNTER — Other Ambulatory Visit (HOSPITAL_COMMUNITY): Payer: Self-pay

## 2022-10-27 ENCOUNTER — Encounter: Payer: Self-pay | Admitting: Internal Medicine

## 2022-10-27 NOTE — Progress Notes (Deleted)
[place] for Infectious Disease      HPI: Dakota Johnson is a 57 y.o. male with HIV manafgemnt.  Initial visit: He is on Biktarvy. Released from Incarceration on 11/01/21. Last seen by Venora Maples NP at [facility] on /19/22. Last dose of Biktarvy 2 weeks ago. Prior to this reports 100% adherence.  He was incarcerated for 19 months. Prior to this he received his HIV care at [facility], noted to have intermittent adherence to ART Today 10/2: plan was to follow-up in August. Pt has been off of meds since June. He states he ran out and did not pick up meds. 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 Speaks to family with HIV Etoh/drug/tobacco use:  social/Hx of smoking crack/cocaine a week ago/ current smoker  Past Medical History:  Diagnosis Date   Drug abuse ([place])    HIV (human immunodeficiency virus infection) ([place])     Past Surgical History:  Procedure Laterality Date   FINGER SURGERY      No family history on file. Current Outpatient Medications on File Prior to Visit  Medication Sig Dispense Refill   bictegravir-emtricitabine-tenofovir AF (BIKTARVY) 50-200-25 MG TABS tablet Take 1 tablet by mouth daily. Try to take at the same time each day with or without food. 30 tablet 5   brompheniramine-pseudoephedrine-DM 30-2-10 MG/5ML syrup Take 5 mLs by mouth 4 (four) times daily as needed. (Patient not taking: Reported on 03/08/2022) 120 mL 0   hydrocerin (EUCERIN) CREA Apply 1 application topically daily as needed (For dry skin.). (Patient not taking: Reported on 03/08/2022)     ibuprofen (ADVIL,MOTRIN) 600 MG tablet Take 1 tablet (600 mg total) by mouth  every 8 (eight) hours as needed. (Patient not taking: Reported on 03/08/2022) 15 tablet 0   nicotine polacrilex (NICORETTE) 4 MG gum Take 1 each (4 mg total) by mouth as needed for smoking cessation. (Patient not taking: Reported on 07/26/2022) 100 tablet 0   sulfamethoxazole-trimethoprim (BACTRIM DS) 800-160 MG tablet Take 1 tablet by mouth 2 (two) times daily. (Patient not taking: Reported on 03/08/2022) 20 tablet 0   No current facility-administered medications on file prior to visit.    No Known Allergies  {LUNG POSITIVES LIST:21941} {HIV Meds (brief):18939}  Single Tablet Regimen {CHL AMB HIV MEDICATION SINGLE TABLET REGIMENS:210130301}  Integrase Inhibitors {CHL AMB HIV MEDICATION INTEGRASE INHIBITORS:210130302}  Protease Inhibitors {CHL AMB HIV MEDICATIONS PROTEASE INHIBITORS:210130303}  PK Enhancer {CHL AMB HIV MEDICATIONS PK ENHANCER:210130304}  Nucleoside/Nucleotide Reverse Transcriptase Inhibitors ([employer]) {CHL AMB HIV MEDICATIONS NUCLEOSIDE/NUCLEOTIDE REVERSE TRANSCRIPTASE INHIBITORS (NUKES):210130305}  Non-Nucleoside Reverse Transcriptase Inhibitors (Non-Nukes) {CHL AMB HIV MEDICATIONS NON-NUCLEOSIDE REVERSE TRANSCRIPTASE INHIBITORS (NON-NUKES):210130306}  Legacy Drugs {CHL AMB HIV MEDICATIONS LEGACY DRUGS:210130307}  Have you ever been diagnosed with any opportunistic infections? {CHL AMB HIV OPPORTUNISTIC INFECTIONS:210130300}  Lab Results HIV 1 RNA Quant  Date Value  07/26/2022 10,100 Copies/mL (H)  03/08/2022 487 copies/mL (H)   CD4 T Cell Abs (/uL)  Date Value  07/26/2022 274 (L)  03/08/2022 446   No results found for: "HIV1GENOSEQ" Lab Results  Component Value Date   WBC 4.8 07/26/2022   HGB 13.7 07/26/2022   HCT 40.5 07/26/2022   MCV 94.2 07/26/2022  PLT 217 07/26/2022    Lab Results  Component Value Date   CREATININE 0.98 07/26/2022   BUN 15 07/26/2022   NA 139 07/26/2022   K 4.3 07/26/2022   CL 106 07/26/2022   CO2 24 07/26/2022   Lab  Results  Component Value Date   ALT 14 07/26/2022   AST 17 07/26/2022   ALKPHOS 65 07/11/2016   BILITOT 0.4 07/26/2022    Lab Results  Component Value Date   CHOL 161 03/08/2022   TRIG 98 03/08/2022   HDL 44 03/08/2022   LDLCALC 98 03/08/2022   Lab Results  Component Value Date   HAV REACTIVE (A) 03/08/2022   Lab Results  Component Value Date   HEPBSAG NON-REACTIVE 03/08/2022   HEPBSAB REACTIVE (A) 03/08/2022   No results found for: "HCVAB" Lab Results  Component Value Date   CHLAMYDIAWP Negative 07/26/2022   N Negative 07/26/2022   No results found for: "GCPROBEAPT" No results found for: "QUANTGOLD"  Plan #HIV/Asymptomatic #ART non- adherence -VL 10k, cd4 247on 07/26/22 Plan -HIV labs today  -Counseled on medication adherence -Continue Biktarvy -Follow-up in  3 months   #Tobacco abuse -Counseled on smoking cessation -Nicorette gum      #Smoking Crack cocian -last use a week ago. -[facility] intake number provided for rehab.    #hx of mildly elevated Scr 1.31 on 09/11/22 #Vaccination COVID: Moderna 07/09/20, 08/06/20, booster 03/16/21, 10/06/21 Flu  07/26/22 PCV 12 10/06/20, 23-7/27/22 Meningitis-[place]own HepA/HEpB-Twinrix 10/06/20 11/03/20, 04/06/21. HBV and HAV ab postive Tdap-unknown Shingles-unkwon   #Health maintenance -Quantifero negative 03/08/22 -RPR NR 03/08/22, 07/26/22 -HCV NR/15/23, 07/26/22 -Colonoscopy-discuss at next visit   Laurice Record, MD [place] for [facility]

## 2023-02-24 ENCOUNTER — Other Ambulatory Visit: Payer: Self-pay | Admitting: Pharmacist

## 2023-02-24 DIAGNOSIS — B20 Human immunodeficiency virus [HIV] disease: Secondary | ICD-10-CM

## 2023-02-24 MED ORDER — BIKTARVY 50-200-25 MG PO TABS
1.0000 | ORAL_TABLET | Freq: Every day | ORAL | 0 refills | Status: AC
Start: 2023-02-21 — End: 2023-02-28

## 2023-02-24 NOTE — Progress Notes (Signed)
Medication Samples have been provided to the patient.  Drug name: Biktarvy        Strength: 50/200/25 mg       Qty: 7 tablets (1 bottle)   LOT: [id number]   Exp.Date: 05/2025  Dosing instructions: Take one tablet by mouth once daily  The patient has been instructed regarding the correct time, dose, and frequency of taking this medication, including desired effects and most common side effects.   Xzavior Reinig L. Jannette Fogo, PharmD, BCIDP, AAHIVP, CPP Clinical Pharmacist Practitioner Infectious Diseases Clinical Pharmacist [facility] 10/06/2020, 10:07 AM

## 2023-03-01 ENCOUNTER — Ambulatory Visit: Payer: Medicaid Other | Admitting: Internal Medicine

## 2023-03-01 NOTE — Progress Notes (Deleted)
Patient Active Problem List   Diagnosis Date Noted   Cocaine abuse (HCC) 05/30/2016    Patient's Medications  New Prescriptions   No medications on file  Previous Medications   BICTEGRAVIR-EMTRICITABINE-TENOFOVIR AF (BIKTARVY) 50-200-25 MG TABS TABLET    Take 1 tablet by mouth daily. Try to take at the same time each day with or without food.   BROMPHENIRAMINE-PSEUDOEPHEDRINE-DM 30-2-10 MG/5ML SYRUP    Take 5 mLs by mouth 4 (four) times daily as needed.   HYDROCERIN (EUCERIN) CREA    Apply 1 application topically daily as needed (For dry skin.).   IBUPROFEN (ADVIL,MOTRIN) 600 MG TABLET    Take 1 tablet (600 mg total) by mouth every 8 (eight) hours as needed.   NICOTINE POLACRILEX (NICORETTE) 4 MG GUM    Take 1 each (4 mg total) by mouth as needed for smoking cessation.   SULFAMETHOXAZOLE-TRIMETHOPRIM (BACTRIM DS) 800-160 MG TABLET    Take 1 tablet by mouth 2 (two) times daily.  Modified Medications   No medications on file  Discontinued Medications   No medications on file    Subjective: ***   Review of Systems: ROS  Past Medical History:  Diagnosis Date   Drug abuse (HCC)    HIV (human immunodeficiency virus infection) (HCC)     Social History   Tobacco Use   Smoking status: Every Day    Packs/day: 1    Types: Cigarettes   Smokeless tobacco: Never  Substance Use Topics   Alcohol use: Yes    Alcohol/week: 10.0 standard drinks of alcohol    Types: 10 Cans of beer per week   Drug use: Yes    Types: Cocaine    Comment: K2    No family history on file.  No Known Allergies  Health Maintenance  Topic Date Due   COVID-19 Vaccine (1) Never done   Zoster Vaccines- Shingrix (1 of 2) Never done   COLONOSCOPY (Pts 45-58yrs Insurance coverage will need to be confirmed)  Never done   INFLUENZA VACCINE  05/26/2023   DTaP/Tdap/Td (2 - Td or Tdap) 04/22/2029   Hepatitis C Screening  Completed   HIV Screening  Completed   HPV VACCINES  Aged Out     Objective:  There were no vitals filed for this visit. There is no height or weight on file to calculate BMI.  Physical Exam  Lab Results Lab Results  Component Value Date   WBC 4.8 07/26/2022   HGB 13.7 07/26/2022   HCT 40.5 07/26/2022   MCV 94.2 07/26/2022   PLT 217 07/26/2022    Lab Results  Component Value Date   CREATININE 0.98 07/26/2022   BUN 15 07/26/2022   NA 139 07/26/2022   K 4.3 07/26/2022   CL 106 07/26/2022   CO2 24 07/26/2022    Lab Results  Component Value Date   ALT 14 07/26/2022   AST 17 07/26/2022   ALKPHOS 65 07/11/2016   BILITOT 0.4 07/26/2022    Lab Results  Component Value Date   CHOL 161 03/08/2022   HDL 44 03/08/2022   LDLCALC 98 03/08/2022   TRIG 98 03/08/2022   CHOLHDL 3.7 03/08/2022   Lab Results  Component Value Date   LABRPR NON-REACTIVE 07/26/2022   HIV 1 RNA Quant  Date Value  07/26/2022 10,100 Copies/mL (H)  03/08/2022 487 copies/mL (H)   CD4 T Cell Abs (/uL)  Date Value  07/26/2022 274 (L)  03/08/2022 446  Problem List Items Addressed This Visit   None  Assessment/Plan    Danelle Earthly, MD [facility] 03/01/2023, 2:19 PM

## 2023-03-27 ENCOUNTER — Encounter (HOSPITAL_BASED_OUTPATIENT_CLINIC_OR_DEPARTMENT_OTHER): Payer: Self-pay | Admitting: *Deleted

## 2023-03-27 ENCOUNTER — Emergency Department (HOSPITAL_BASED_OUTPATIENT_CLINIC_OR_DEPARTMENT_OTHER): Payer: Medicaid Other

## 2023-03-27 ENCOUNTER — Emergency Department (HOSPITAL_BASED_OUTPATIENT_CLINIC_OR_DEPARTMENT_OTHER)
Admission: EM | Admit: 2023-03-27 | Discharge: 2023-03-28 | Disposition: A | Discharge status: Home / Self Care | Payer: Medicaid Other | Attending: Emergency Medicine | Admitting: Emergency Medicine

## 2023-03-27 ENCOUNTER — Other Ambulatory Visit: Payer: Self-pay

## 2023-03-27 DIAGNOSIS — S161XXA Strain of muscle, fascia and tendon at neck level, initial encounter: Secondary | ICD-10-CM

## 2023-03-27 DIAGNOSIS — S20219A Contusion of unspecified front wall of thorax, initial encounter: Secondary | ICD-10-CM

## 2023-03-27 DIAGNOSIS — S022XXA Fracture of nasal bones, initial encounter for closed fracture: Secondary | ICD-10-CM | POA: Diagnosis not present

## 2023-03-27 DIAGNOSIS — S01511A Laceration without foreign body of lip, initial encounter: Secondary | ICD-10-CM | POA: Diagnosis not present

## 2023-03-27 DIAGNOSIS — S01112A Laceration without foreign body of left eyelid and periocular area, initial encounter: Secondary | ICD-10-CM | POA: Insufficient documentation

## 2023-03-27 DIAGNOSIS — M542 Cervicalgia: Secondary | ICD-10-CM | POA: Insufficient documentation

## 2023-03-27 DIAGNOSIS — S0990XA Unspecified injury of head, initial encounter: Secondary | ICD-10-CM

## 2023-03-27 MED ORDER — LIDOCAINE HCL (PF) 1 % IJ SOLN
5.0000 mL | Freq: Once | INTRAMUSCULAR | Status: AC
  Administered 2023-03-27: 5 mL via INTRADERMAL
  Filled 2023-03-27: qty 5
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
## 2023-03-27 NOTE — ED Notes (Signed)
ED Provider at bedside. 
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
## 2023-03-28 MED ORDER — CLINDAMYCIN HCL 300 MG PO CAPS: 300.0000 mg | ORAL_CAPSULE | Freq: Four times a day (QID) | ORAL | 0 refills | Status: AC

## 2023-03-28 MED ORDER — CLINDAMYCIN HCL 300 MG PO CAPS: 300.0000 mg | ORAL_CAPSULE | Freq: Four times a day (QID) | ORAL | 0 refills | Status: DC

## 2023-03-28 MED ORDER — HYDROCODONE-ACETAMINOPHEN 5-325 MG PO TABS
2.0000 | ORAL_TABLET | Freq: Once | ORAL | Status: AC
  Administered 2023-03-28: 2 via ORAL
  Filled 2023-03-28: qty 2

## 2023-03-28 MED ORDER — HYDROCODONE-ACETAMINOPHEN 5-325 MG PO TABS: 1.0000 | ORAL_TABLET | Freq: Four times a day (QID) | ORAL | 0 refills | Status: DC | PRN

## 2023-03-28 MED ORDER — CLINDAMYCIN HCL 150 MG PO CAPS
300.0000 mg | ORAL_CAPSULE | Freq: Once | ORAL | Status: AC
  Administered 2023-03-28: 300 mg via ORAL
  Filled 2023-03-28: qty 2

## 2023-03-28 MED ORDER — HYDROCODONE-ACETAMINOPHEN 5-325 MG PO TABS: 1.0000 | ORAL_TABLET | Freq: Four times a day (QID) | ORAL | 0 refills | Status: AC | PRN

## 2023-03-28 NOTE — Discharge Instructions (Signed)
Begin taking clindamycin as prescribed.  Take ibuprofen 600 mg every 6 hours as needed for pain.  Take hydrocodone as prescribed as needed for pain not relieved with ibuprofen.  Ice your nose for 20 minutes every 2 hours while awake for the next 2 days.  Follow-up with ENT later this week regarding your nasal bone fractures.  The contact information for [facility] ear, nose, and throat has been provided in this discharge summary for you to call and make these arrangements.

## 2023-05-17 ENCOUNTER — Ambulatory Visit: Payer: Medicaid Other | Admitting: Internal Medicine

## 2023-05-17 NOTE — Progress Notes (Deleted)
[facility]     HPI: Dakota Johnson is a 57 y.o. male  with HIV presents to establish care. He is on [employer]. Released from Incarceration on 11/01/21. Last seen by Fredna Dow NP at [facility] on /19/22. Last dose of Biktarvy 2 weeks ago. Prior to this reports 100% adherence.  He was incarcerated for 19 months. Prior to this he received his HIV care at [facility], noted to have intermittent adherence to ART Today 10/2: plan was to follow-up in August. Pt has been off of meds since June. He states he ran out and did not pick up meds. 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 Speaks to family with HIV Etoh/drug/tobacco use:  social/Hx of smoking crack/cocaine a week ago/ current smoker Past Medical History:  Diagnosis Date   Drug abuse (HCC)    HIV (human immunodeficiency virus infection) (HCC)     Past Surgical History:  Procedure Laterality Date   FINGER SURGERY      No family history on file. Current Outpatient Medications on File Prior to Visit  Medication Sig Dispense Refill   bictegravir-emtricitabine-tenofovir AF (BIKTARVY) 50-200-25 MG TABS tablet Take 1 tablet by mouth daily. Try to take at the same time each day with or without food. 30 tablet 5   clindamycin (CLEOCIN) 300 MG capsule Take 1 capsule (300 mg total) by mouth 4 (four) times daily. X 7 days 28 capsule 0   hydrocerin (EUCERIN) CREA Apply 1 application topically daily as needed (For dry skin.). (Patient not taking: Reported on 03/08/2022)     HYDROcodone-acetaminophen (NORCO) 5-325 MG tablet Take 1-2 tablets by mouth every 6 (six) hours as needed. 15 tablet 0    sulfamethoxazole-trimethoprim (BACTRIM DS) 800-160 MG tablet Take 1 tablet by mouth 2 (two) times daily. (Patient not taking: Reported on 03/08/2022) 20 tablet 0   No current facility-administered medications on file prior to visit.    No Known Allergies    Lab Results HIV 1 RNA Quant  Date Value  07/26/2022 10,100 Copies/mL (H)  03/08/2022 487 copies/mL (H)   CD4 T Cell Abs (/uL)  Date Value  07/26/2022 274 (L)  03/08/2022 446   No results found for: "HIV1GENOSEQ" Lab Results  Component Value Date   WBC 4.8 07/26/2022   HGB 13.7 07/26/2022   HCT 40.5 07/26/2022   MCV 94.2 07/26/2022   PLT 217 07/26/2022    Lab Results  Component Value Date   CREATININE 0.98 07/26/2022   BUN 15 07/26/2022   NA 139 07/26/2022   K 4.3 07/26/2022   CL 106 07/26/2022   CO2 24 07/26/2022   Lab Results  Component Value Date   ALT 14 07/26/2022   AST 17 07/26/2022   ALKPHOS 65 07/11/2016   BILITOT 0.4 07/26/2022    Lab Results  Component Value Date   CHOL 161 03/08/2022   TRIG 98 03/08/2022   HDL 44 03/08/2022   LDLCALC 98 03/08/2022   Lab Results  Component Value Date   HAV REACTIVE (A) 03/08/2022   Lab Results  Component Value Date   HEPBSAG NON-REACTIVE 03/08/2022   HEPBSAB REACTIVE (A) 03/08/2022   No results found for: "HCVAB" Lab  Results  Component Value Date   CHLAMYDIAWP Negative 07/26/2022   N Negative 07/26/2022   No results found for: "GCPROBEAPT" No results found for: "QUANTGOLD"  Assessment/Plan #HIV/Asymptomatic #ART non- adherence -VL 487, CD4 446 on 03/08/22 Plan -HIV labs today  -Counseled on medication adherence -Continue Biktarvy -Follow-up in  3 months   #Tobacco abuse -Counseled on smoking cessation -Nicorette gum      #Smoking Crack cocian -last use a week ago. -[facility] intake number provided for rehab.    #hx of mildly elevated Scr 1.31 on 09/11/22 #Vaccination COVID: Moderna 07/09/20, 08/06/20, booster 03/16/21, 10/06/21 Flu  today 07/26/22 PCV 12 10/06/20, 23-7/27/22 Meningitis-[place] HepA/HEpB-Twinrix 10/06/20 11/03/20, 04/06/21. HBV and HAV ab postive Tdap-unknown Shingles-[place]   #Health maintenance -Quantifero negative 03/08/22 -RPR NR 03/08/22, today 07/26/22 -HCV NR/15/23, today 07/26/22 -Colonoscopy-discuss at next visit -Anal pap -The 10-year ASCVD risk score (Arnett DK, et al., 2019) is: 11.1%   Values used to calculate the score:     Age: 50 years     Sex: Male     Is Non-Hispanic African American: Yes     Diabetic: No     Tobacco smoker: Yes     Systolic Blood Pressure: 120 mmHg     Is BP treated: No     HDL Cholesterol: 44 mg/dL     Total Cholesterol: 161 mg/dL   Danelle Earthly, MD [facility]

## 2023-05-19 ENCOUNTER — Telehealth: Payer: Self-pay

## 2023-05-19 ENCOUNTER — Other Ambulatory Visit: Payer: Self-pay

## 2023-05-19 NOTE — Telephone Encounter (Signed)
Detectable Viral Load Intervention (DVL)  Most recent VL:  HIV 1 RNA Quant  Date Value Ref Range Status  07/26/2022 10,100 (H) Copies/mL Final  03/08/2022 487 (H) copies/mL Final    Last Clinic Visit: 08/13/22  Current ART regimen: Biktarvy  Appointment status: patient does not have future appointment scheduled   Medication last dispensed (per chart review): 09/22/22   Interventions   Called patient to discuss medication adherence and possible barriers to care. Call cannot be completed at this time. Will mail letter requesting reschedule appointment.  Juanita Laster, RMA

## 2023-10-20 ENCOUNTER — Telehealth: Payer: Self-pay

## 2023-10-20 ENCOUNTER — Other Ambulatory Visit (HOSPITAL_COMMUNITY): Payer: Self-pay

## 2023-10-20 NOTE — Telephone Encounter (Signed)
[facility] Patient Product/process development scientist completed.    The patient is insured through Rx [employer] & Rx [facility] Medicaid and has a 0.00 copay.  We will continue to follow to see if copay assistance is needed.  Clearance Coots, CPhT Specialty Pharmacy Patient [facility] Phone: [phone] Fax:  [phone]

## 2023-10-24 ENCOUNTER — Encounter: Payer: Self-pay | Admitting: *Deleted

## 2023-10-24 LAB — GLUCOSE, POCT (MANUAL RESULT ENTRY): POC Glucose: 91 mg/dL (ref 70–99)

## 2023-10-24 NOTE — Congregational Nurse Program (Signed)
  Dept: [phone]   Congregational Nurse Program Note  Date of Encounter: 10/24/2023  Past Medical History: Past Medical History:  Diagnosis Date   Drug abuse (HCC)    HIV (human immunodeficiency virus infection) [facility])     Encounter Details:  Community Questionnaire - 10/24/23 1502       Questionnaire   Ask client: Do you give verbal consent for me to treat you today? Yes    Student Assistance N/A    Location Patient Served  [facility]    Encounter Setting CN site    Population Status Unhoused    Insurance Medicaid    Insurance/Financial Assistance Referral N/A    Medication Have Medication Insecurities    Medical Provider No    Screening Referrals Made N/A    Medical Referrals Made N/A    Medical Appointment Completed N/A    CNP Interventions Advocate/Support    Screenings CN Performed N/A    ED Visit Averted N/A    Life-Saving Intervention Made N/A            Client came to nurse's office requesting help with appt to [facility] to get medication. Per call, client has and appt for January 7th. Information written and given to client.  Sayge Brienza W RN CN

## 2023-11-01 ENCOUNTER — Ambulatory Visit: Payer: Medicaid Other | Admitting: Internal Medicine

## 2023-11-01 NOTE — Progress Notes (Deleted)
 Patient Active Problem List   Diagnosis Date Noted   Cocaine abuse (HCC) 05/30/2016    Patient's Medications  New Prescriptions   No medications on file  Previous Medications   BICTEGRAVIR-EMTRICITABINE-TENOFOVIR AF (BIKTARVY ) 50-200-25 MG TABS TABLET    Take 1 tablet by mouth daily. Try to take at the same time each day with or without food.   CLINDAMYCIN  (CLEOCIN ) 300 MG CAPSULE    Take 1 capsule (300 mg total) by mouth 4 (four) times daily. X 7 days   HYDROCERIN (EUCERIN) CREA    Apply 1 application topically daily as needed (For dry skin.).   HYDROCODONE -ACETAMINOPHEN  (NORCO) 5-325 MG TABLET    Take 1-2 tablets by mouth every 6 (six) hours as needed.   SULFAMETHOXAZOLE -TRIMETHOPRIM  (BACTRIM  DS) 800-160 MG TABLET    Take 1 tablet by mouth 2 (two) times daily.  Modified Medications   No medications on file  Discontinued Medications   No medications on file    Subjective: ***  Today @DATE @ : Discussed the use of AI scribe software for clinical note transcription with the patient, who gave verbal consent to proceed.  History of Present Illness            Review of Systems: ROS  Past Medical History:  Diagnosis Date   Drug abuse (HCC)    HIV (human immunodeficiency virus infection) (HCC)     Social History   Tobacco Use   Smoking status: Every Day    Current packs/day: 1.00    Types: Cigarettes   Smokeless tobacco: Never  Substance Use Topics   Alcohol use: Yes    Alcohol/week: 10.0 standard drinks of alcohol    Types: 10 Cans of beer per week    Comment: occasional   Drug use: Yes    Types: Cocaine    Comment: states clean for 2 months    No family history on file.  No Known Allergies  Health Maintenance  Topic Date Due   COVID-19 Vaccine (1) Never done   Zoster Vaccines- Shingrix (1 of 2) Never done   Colonoscopy  Never done   INFLUENZA VACCINE  05/26/2023   DTaP/Tdap/Td (2 - Td or Tdap) 04/22/2029   Hepatitis C Screening  Completed    HIV Screening  Completed   HPV VACCINES  Aged Out    Objective:  There were no vitals filed for this visit. There is no height or weight on file to calculate BMI.  Physical Exam Physical Exam          Lab Results Lab Results  Component Value Date   WBC 4.8 07/26/2022   HGB 13.7 07/26/2022   HCT 40.5 07/26/2022   MCV 94.2 07/26/2022   PLT 217 07/26/2022    Lab Results  Component Value Date   CREATININE 0.98 07/26/2022   BUN 15 07/26/2022   NA 139 07/26/2022   K 4.3 07/26/2022   CL 106 07/26/2022   CO2 24 07/26/2022    Lab Results  Component Value Date   ALT 14 07/26/2022   AST 17 07/26/2022   ALKPHOS 65 07/11/2016   BILITOT 0.4 07/26/2022    Lab Results  Component Value Date   CHOL 161 03/08/2022   HDL 44 03/08/2022   LDLCALC 98 03/08/2022   TRIG 98 03/08/2022   CHOLHDL 3.7 03/08/2022   Lab Results  Component Value Date   LABRPR NON-REACTIVE 07/26/2022   HIV 1 RNA Quant  Date Value  07/26/2022 10,100 Copies/mL (H)  03/08/2022 487 copies/mL (H)   CD4 T Cell Abs (/uL)  Date Value  07/26/2022 274 (L)  03/08/2022 446     Problem List Items Addressed This Visit   None  Results          Assessment/Plan Assessment and Plan                Loney Stank, MD [facility] 11/01/2023, 1:00 PM

## 2023-11-16 ENCOUNTER — Other Ambulatory Visit (HOSPITAL_COMMUNITY): Payer: Self-pay

## 2023-11-22 ENCOUNTER — Ambulatory Visit: Payer: Medicaid Other | Admitting: Internal Medicine

## 2023-11-22 ENCOUNTER — Telehealth: Payer: Self-pay

## 2023-11-22 NOTE — Telephone Encounter (Signed)
Attempted to call to reschedule No Show 11/22/23 Dr.Singh. Unable to reach or leave vm at this time.

## 2024-01-23 ENCOUNTER — Telehealth: Payer: Self-pay

## 2024-01-23 ENCOUNTER — Ambulatory Visit: Payer: 59 | Admitting: Internal Medicine

## 2024-01-23 NOTE — Telephone Encounter (Signed)
 Attempted to reschedule No Show from today with Netta Corrigan MD.Unable to reach patient at this time. No Vm.

## 2024-05-08 NOTE — Congregational Nurse Program (Signed)
  Dept: [phone]   Congregational Nurse Program Note  Date of Encounter: 05/08/2024 Client to [facility] day center, new to nurse led clinic, with request for foot care. Toe nails trimmed and filed. Heel balm applied. No open areas or blisters noted. RN to continue to build rapport with client to determine further needs. MARLA Marina BSN, RN Past Medical History: Past Medical History:  Diagnosis Date   Drug abuse (HCC)    HIV (human immunodeficiency virus infection) [facility])     Encounter Details:  Community Questionnaire - 05/08/24 1135       Questionnaire   Ask client: Do you give verbal consent for me to treat you today? Yes    Student Assistance N/A    Location Patient Served  [facility]    Encounter Setting CN site    Population Status Unhoused    Insurance Medicaid    Insurance/Financial Assistance Referral N/A    Medication Have Medication Insecurities    Medical Provider No    Screening Referrals Made N/A    Medical Referrals Made N/A    Medical Appointment Completed N/A    CNP Interventions Advocate/Support;Educate    Screenings CN Performed N/A    ED Visit Averted N/A    Life-Saving Intervention Made N/A
